# Patient Record
Sex: Female | Born: 1965 | Race: White | Hispanic: No | Marital: Married | Smoking: Never smoker
Health system: Southern US, Community
[De-identification: ages and names within clinical notes are randomized; demographics above are authoritative.]

## PROBLEM LIST (undated history)

## (undated) DIAGNOSIS — L4050 Arthropathic psoriasis, unspecified: Secondary | ICD-10-CM

## (undated) DIAGNOSIS — E119 Type 2 diabetes mellitus without complications: Secondary | ICD-10-CM

## (undated) DIAGNOSIS — M1990 Unspecified osteoarthritis, unspecified site: Secondary | ICD-10-CM

## (undated) DIAGNOSIS — M4800 Spinal stenosis, site unspecified: Secondary | ICD-10-CM

---

## 1998-07-11 ENCOUNTER — Inpatient Hospital Stay (HOSPITAL_COMMUNITY): Admission: AD | Admit: 1998-07-11 | Discharge: 1998-07-11 | Payer: Self-pay | Admitting: Obstetrics and Gynecology

## 1999-08-13 ENCOUNTER — Encounter: Payer: Self-pay | Admitting: Obstetrics and Gynecology

## 1999-08-13 ENCOUNTER — Ambulatory Visit (HOSPITAL_COMMUNITY): Admission: RE | Admit: 1999-08-13 | Discharge: 1999-08-13 | Payer: Self-pay | Admitting: Obstetrics and Gynecology

## 1999-12-11 ENCOUNTER — Encounter: Payer: Self-pay | Admitting: Obstetrics and Gynecology

## 1999-12-11 ENCOUNTER — Ambulatory Visit (HOSPITAL_COMMUNITY): Admission: RE | Admit: 1999-12-11 | Discharge: 1999-12-11 | Payer: Self-pay | Admitting: Obstetrics and Gynecology

## 2000-02-17 ENCOUNTER — Ambulatory Visit (HOSPITAL_COMMUNITY): Admission: RE | Admit: 2000-02-17 | Discharge: 2000-02-17 | Payer: Self-pay | Admitting: Obstetrics and Gynecology

## 2000-02-17 ENCOUNTER — Encounter: Payer: Self-pay | Admitting: Obstetrics and Gynecology

## 2000-02-20 ENCOUNTER — Ambulatory Visit (HOSPITAL_COMMUNITY): Admission: RE | Admit: 2000-02-20 | Discharge: 2000-02-20 | Payer: Self-pay | Admitting: Obstetrics and Gynecology

## 2000-03-18 ENCOUNTER — Encounter: Payer: Self-pay | Admitting: Obstetrics and Gynecology

## 2000-03-19 ENCOUNTER — Inpatient Hospital Stay (HOSPITAL_COMMUNITY): Admission: AD | Admit: 2000-03-19 | Discharge: 2000-03-19 | Payer: Self-pay | Admitting: Obstetrics and Gynecology

## 2000-03-23 ENCOUNTER — Encounter (HOSPITAL_COMMUNITY): Admission: RE | Admit: 2000-03-23 | Discharge: 2000-03-26 | Payer: Self-pay | Admitting: Obstetrics and Gynecology

## 2000-03-25 ENCOUNTER — Inpatient Hospital Stay (HOSPITAL_COMMUNITY): Admission: AD | Admit: 2000-03-25 | Discharge: 2000-03-28 | Payer: Self-pay | Admitting: Obstetrics and Gynecology

## 2000-03-25 ENCOUNTER — Encounter (INDEPENDENT_AMBULATORY_CARE_PROVIDER_SITE_OTHER): Payer: Self-pay | Admitting: Specialist

## 2000-03-29 ENCOUNTER — Encounter: Admission: RE | Admit: 2000-03-29 | Discharge: 2000-04-16 | Payer: Self-pay | Admitting: Obstetrics and Gynecology

## 2003-09-05 ENCOUNTER — Other Ambulatory Visit: Admission: RE | Admit: 2003-09-05 | Discharge: 2003-09-05 | Payer: Self-pay | Admitting: Obstetrics and Gynecology

## 2005-05-16 ENCOUNTER — Encounter: Admission: RE | Admit: 2005-05-16 | Discharge: 2005-05-16 | Payer: Self-pay | Admitting: Family Medicine

## 2008-01-12 ENCOUNTER — Encounter: Admission: RE | Admit: 2008-01-12 | Discharge: 2008-01-12 | Payer: Self-pay | Admitting: Family Medicine

## 2009-01-30 ENCOUNTER — Encounter: Admission: RE | Admit: 2009-01-30 | Discharge: 2009-01-30 | Payer: Self-pay | Admitting: Family Medicine

## 2010-08-31 ENCOUNTER — Emergency Department (HOSPITAL_COMMUNITY): Payer: BC Managed Care – PPO

## 2010-08-31 ENCOUNTER — Emergency Department (HOSPITAL_COMMUNITY)
Admission: EM | Admit: 2010-08-31 | Discharge: 2010-08-31 | Disposition: A | Discharge status: Home / Self Care | Payer: BC Managed Care – PPO | Attending: Emergency Medicine | Admitting: Emergency Medicine

## 2010-08-31 DIAGNOSIS — R0989 Other specified symptoms and signs involving the circulatory and respiratory systems: Secondary | ICD-10-CM | POA: Insufficient documentation

## 2010-08-31 DIAGNOSIS — M069 Rheumatoid arthritis, unspecified: Secondary | ICD-10-CM | POA: Insufficient documentation

## 2010-08-31 DIAGNOSIS — E041 Nontoxic single thyroid nodule: Secondary | ICD-10-CM | POA: Insufficient documentation

## 2010-08-31 DIAGNOSIS — R0609 Other forms of dyspnea: Secondary | ICD-10-CM | POA: Insufficient documentation

## 2010-08-31 DIAGNOSIS — R4789 Other speech disturbances: Secondary | ICD-10-CM | POA: Insufficient documentation

## 2010-08-31 DIAGNOSIS — I10 Essential (primary) hypertension: Secondary | ICD-10-CM | POA: Insufficient documentation

## 2010-08-31 DIAGNOSIS — R072 Precordial pain: Secondary | ICD-10-CM | POA: Insufficient documentation

## 2010-08-31 DIAGNOSIS — R0602 Shortness of breath: Secondary | ICD-10-CM | POA: Insufficient documentation

## 2010-08-31 DIAGNOSIS — I517 Cardiomegaly: Secondary | ICD-10-CM | POA: Insufficient documentation

## 2010-08-31 LAB — PREGNANCY, URINE: Preg Test, Ur: NEGATIVE

## 2010-08-31 LAB — BASIC METABOLIC PANEL
Chloride: 104 mEq/L (ref 96–112)
GFR calc non Af Amer: >60 mL/min (ref >60)
Potassium: 3.3 mEq/L — ABNORMAL LOW (ref 3.5–5.1)
Sodium: 140 mEq/L (ref 135–145)

## 2010-08-31 LAB — CBC
Hemoglobin: 13.2 g/dL (ref 12.0–15.0)
MCV: 89.1 fL (ref 78.0–100.0)
Platelets: 227 10*3/uL (ref 150–400)
RBC: 4.11 MIL/uL (ref 3.87–5.11)
WBC: 8 10*3/uL (ref 4.0–10.5)

## 2010-08-31 LAB — URINALYSIS, ROUTINE W REFLEX MICROSCOPIC
Nitrite: NEGATIVE
Specific Gravity, Urine: 1.028 (ref 1.005–1.030)
Urobilinogen, UA: 0.2 mg/dL (ref 0.0–1.0)

## 2010-08-31 LAB — URINE MICROSCOPIC-ADD ON

## 2010-08-31 LAB — ETHANOL: Alcohol, Ethyl (B): <5 mg/dL (ref 0–10)

## 2010-08-31 LAB — POCT CARDIAC MARKERS: Myoglobin, poc: 19.5 ng/mL (ref 12–200)

## 2010-08-31 LAB — D-DIMER, QUANTITATIVE: D-Dimer, Quant: 0.88 ug/mL-FEU — ABNORMAL HIGH (ref 0.00–0.48)

## 2010-08-31 LAB — DIFFERENTIAL
Eosinophils Absolute: 0.1 10*3/uL (ref 0.0–0.7)
Lymphs Abs: 1.6 10*3/uL (ref 0.7–4.0)
Neutro Abs: 5.5 10*3/uL (ref 1.7–7.7)
Neutrophils Relative %: 68 % (ref 43–77)

## 2010-08-31 LAB — RAPID URINE DRUG SCREEN, HOSP PERFORMED
Amphetamines: NOT DETECTED
Tetrahydrocannabinol: NOT DETECTED

## 2010-08-31 MED ORDER — IOHEXOL 300 MG/ML  SOLN: 100.0000 mL | Freq: Once | INTRAMUSCULAR | Status: DC | PRN

## 2010-09-04 MED ORDER — IOHEXOL 300 MG/ML  SOLN
100.0000 mL | Freq: Once | INTRAMUSCULAR | Status: AC | PRN
  Administered 2010-09-04: 100 mL via INTRAVENOUS

## 2010-11-08 NOTE — H&P (Signed)
[facility]  Patient:    Melody Carpenter, Melody Carpenter                     MRN: [id number] Adm. Date:  [id number] Attending:  Malon Kindle                         History and Physical  HISTORY OF PRESENT ILLNESS:   A 45 year old white married female, para 1-0-1-1 gravida 3, last menstrual period August 04, 1999, [facility] May 10, 2000 by dates and May 12, 2000 by ultrasound, admitted in active labor with twins - vertex/breech.  Blood group and type O positive with negative antibody and nonreactive serology, rubella immune, hepatitis B surface antigen negative, HIV negative, GC and chlamydia negative, triple screen normal, one-hour Glucola 148. Three-hour GTT 77, 195, 162, and 139.  The patient had a vaginal ultrasound on October 03, 1999.  Crown-rump length was 1.2 cm, eight weeks two days, [facility] May 12, 2000, this twin gestation.  By December 11, 1999, the patient had a second ultrasound that showed an average gestational age of [redacted] weeks and three days, and EDC of essentially the same day.  The patient had concordant growth by follow-up ultrasound.  She did have some contractions about a week ago that were stopped with terbutaline.  At 1 a.m. today, she awoke with contractions and bloody mucous. By 3 a.m. her contractions were regular and painful.  She came here and the cervix was 4 cm, ultrasound showed vertex/breech.  PAST MEDICAL HISTORY:  ALLERGIES:                    No known allergies.  OPERATIONS:                   None.  ILLNESSES:                    Anxiety, kidney stones, rheumatid arthritis.  FAMILY HISTORY:               Mother with high blood pressure, father with diabetes.  OBSTETRICAL HISTORY:          In March 1995, 6 pound 5 ounce female vaginally with preterm premature ruptured membranes.  In January 2000, a blighted ovum.  PHYSICAL EXAMINATION ON ADMISSION:  VITAL SIGNS:                  Temperature 98.1, pulse 98,  respirations 22, blood pressure 140/93.  HEENT:                        Negative.  HEART:                        Normal size and sounds, no murmurs.  LUNGS:                        Clear to P&A.  ABDOMEN:                      Soft.  Fundal height had been 44 cm on September room 27, 2001.  Fetal heart tones were reactive.  No decelerations.  PELVIC:                       Cervix 6 cm, 100%, vertex at a -2.  IMAGING:  Ultrasound showed vertex/breech.  ADMITTING IMPRESSION:         Intrauterine pregnancy 33+ weeks, twin gestation, active labor, vertex/breech.  The patient is prepared for C section. DD:  03/25/00 TD:  03/25/00 Job: [id number]

## 2010-11-08 NOTE — Discharge Summary (Signed)
[facility]  Patient:    Melody Carpenter, Melody Carpenter                     MRN: [id number] Adm. Date:  [id number] Disc. Date: [id number] Attending:  Malon Kindle                           Discharge Summary  ADMISSION DIAGNOSES:          1. Twin intrauterine pregnancy at 33 weeks.                               2. Preterm labor.  COMPLICATIONS:                Prenatal care complicated by twin pregnancy with concordant growth.  BRIEF HISTORY:                At approximately 0100 on the day of admission she had some contractions with bloody mucus and two hours later the contractions were regular and painful and she came to be evaluated and her cervix was found to be 4 cm dilated.  Ultrasound revealed the babies to be in the vertex breech presentation.  Prenatal care was otherwise uncomplicated.  PRENATAL LABS:                Blood type O positive with a negative antibody screen, RPR nonreactive, rubella immune, hepatitis B surface antigen negative, HIV negative, gonorrhea and chlamydia negative, triple screen normal, glucola 148.  Three hour GTT 77, 195, 162, and 139.  PAST OB HISTORY:              In March of 1995, she had a vaginal delivery complicated by preterm premature ruptured membranes, 6 pounds 5 ounces, otherwise uncomplicated.  In January of 2000, she had a D&C for a blighted ovum.  PAST MEDICAL HISTORY:         This is significant for anxiety, kidney stones and rheumatoid arthritis.  PHYSICAL EXAMINATION:  VITAL SIGNS:                  She was afebrile with stable vital signs.  ABDOMEN:                      Her abdomen was soft with a fundal height of 44 cm. Fetal heart tracings were reactive without decelerations.  PELVIC:                       On Malachi Pro. Henley, M.D.s, first examination she was 6 cm dilated, completely effaced, and -2 station with vertex presentation.  HOSPITAL COURSE:              The patient was admitted by Dr.  Ambrose Mantle who elected to proceed with a C-section for delivery.  Early on the morning of March 25, 2000, she underwent a low transverse cesarean section with delivery of viable female twins.  Baby A weighed 4 pounds 15 ounces, had Apgars of 8 and 9.  Baby B weighed 4 pounds 5 ounces and also had Apgars of 8 and 9.  Surgery was done under spinal anesthesia with estimated blood loss of 1000 cc. Postpartum she did very well and remained afebrile and was rapidly able to ambulate and tolerate a regular diet.  Predelivery hemoglobin was 12.8, post delivery was 11.6.  The babies did well in the NICU and on March 28, 2000, were to be transferred to the [facility] pediatric intensive care unit to make room in the neonatal intensive care unit.  At this time the patient was felt to be stable enough for discharge home.  Her incision was healing well and her staples were removed and Steri-Strips applied.  CONDITION ON DISCHARGE:       Stable.  DISPOSITION:                  The patient was discharged to home.  DISCHARGE INSTRUCTIONS:       1. She will be on a regular diet.                               2. Activity:  Pelvic rest with                                  no strenuous activity and no driving.                               3. Her follow-up is in 12 to 13 days for an                                  incision check.  DISCHARGE MEDICATIONS:        Percocet p.r.n. pain.  She is given a discharge pamphlet. DD:  03/28/00 TD:  03/30/00 Job: [id number]

## 2010-11-08 NOTE — Op Note (Signed)
[facility]  Patient:    Melody Carpenter, Melody Carpenter                     MRN: [id number] Proc. Date: 03/25/00 Adm. Date:  [id number] Attending:  Malon Kindle                           Operative Report  PREOPERATIVE DIAGNOSIS:       Intrauterine pregnancy at 33+ weeks, active                               labor, twin gestation, vertex breech.  POSTOPERATIVE DIAGNOSIS:      Intrauterine pregnancy at 33+ weeks, active                               labor, twin gestation, vertex breech.  OPERATION:                    Low transverse cervical cesarean section.  SURGEON:                      Malachi Pro. Ambrose Mantle, M.D.  ASSISTANT:  ANESTHESIA:                   Spinal anesthesia.  DESCRIPTION OF PROCEDURE:     The patient was brought to the operating room and given a spinal anesthetic by Gretta Cool., M.D.  She was placed in the left lateral tilt position. Fetal heart tones were recorded as normal. The abdomen, vulva, and urethra were prepped with Betadine solution.  A Foley catheter was inserted to straight drain. The patient had been 6 cm in maternity admission unit. The abdomen was then draped as a sterile field. Anesthesia was confirmed. A transverse incision was made and carried in layers through the skin, subcutaneous tissue and fascia. The fascia was then incised transversely, separated from the rectus muscles superiorly and inferiorly. The rectus muscle was cut in the midline.  The peritoneum was opened vertically.  An incision was made into the lower uterine segment peritoneum. The bladder was pushed inferiorly and the incision was then made into the uterine cavity.  With my finger, after starting the incision with the knife, the incision was extended laterally with the bandage scissors.  Baby A was 4 pounds 15 ounce female delivered vertex without difficulty.  Nose and pharynx were suctioned with the bulb.  Cord was clamped and the infant was  given to Angelita Ingles, M.D., who was in attendance.  I then did an amniotomy on twin B.  Both fluid sacs were completely clear.  The baby B was presenting as a frank breech.  I delivered the buttocks through the incisional opening and gradually pulled down on the baby, delivered each shoulder. The head delivered without difficulty. The bulb dropped on the floor so I clamped the cord and gave the infant to Dr. Ruben Gottron, who was in attendance. The infant B was 4 pounds 5 ounces. It was also female. Both babies had Apgars of 8 at one minute and 9 at five minutes.  The placenta was removed intact. The uterus was palpated inside and found to be free of any more products of conception.  Both tubes and ovaries appeared normal. The uterine incision was closed with  two running sutures of 0 Vicryl locking the first layer and nonlocking on the second layer.  Liberal irrigation confirmed hemostasis.  I did not reperitonealize the uterus or the abdominal wall.  I closed the rectus muscle with several interrupted sutures of 0 Vicryl.  The fascia was closed with two running sutures of 0 Vicryl, subcutaneous tissue with a running 3-0 Vicryl and staples were used for the skin.  The patient seemed to tolerate the procedure well and was returned to recovery in satisfactory condition. DD:  03/25/00 TD:  03/25/00 Job: [id number]

## 2011-04-02 DIAGNOSIS — L4050 Arthropathic psoriasis, unspecified: Secondary | ICD-10-CM | POA: Diagnosis present

## 2012-06-18 ENCOUNTER — Encounter (HOSPITAL_COMMUNITY): Payer: Self-pay | Admitting: Emergency Medicine

## 2012-06-18 ENCOUNTER — Emergency Department (HOSPITAL_COMMUNITY): Payer: BC Managed Care – PPO

## 2012-06-18 ENCOUNTER — Observation Stay (HOSPITAL_COMMUNITY)
Admission: EM | Admit: 2012-06-18 | Discharge: 2012-06-19 | DRG: 294 | Disposition: A | Discharge status: Home / Self Care | Payer: BC Managed Care – PPO | Attending: Internal Medicine | Admitting: Internal Medicine

## 2012-06-18 DIAGNOSIS — E872 Acidosis, unspecified: Secondary | ICD-10-CM | POA: Diagnosis present

## 2012-06-18 DIAGNOSIS — R739 Hyperglycemia, unspecified: Secondary | ICD-10-CM

## 2012-06-18 DIAGNOSIS — Z79899 Other long term (current) drug therapy: Secondary | ICD-10-CM

## 2012-06-18 DIAGNOSIS — L4050 Arthropathic psoriasis, unspecified: Secondary | ICD-10-CM | POA: Diagnosis present

## 2012-06-18 DIAGNOSIS — E1101 Type 2 diabetes mellitus with hyperosmolarity with coma: Principal | ICD-10-CM | POA: Diagnosis present

## 2012-06-18 DIAGNOSIS — E876 Hypokalemia: Secondary | ICD-10-CM | POA: Diagnosis present

## 2012-06-18 DIAGNOSIS — Z794 Long term (current) use of insulin: Secondary | ICD-10-CM

## 2012-06-18 DIAGNOSIS — R0602 Shortness of breath: Secondary | ICD-10-CM

## 2012-06-18 DIAGNOSIS — E871 Hypo-osmolality and hyponatremia: Secondary | ICD-10-CM | POA: Diagnosis present

## 2012-06-18 HISTORY — DX: Arthropathic psoriasis, unspecified: L40.50

## 2012-06-18 HISTORY — DX: Unspecified osteoarthritis, unspecified site: M19.90

## 2012-06-18 LAB — POCT I-STAT 3, VENOUS BLOOD GAS (G3P V)
Acid-Base Excess: 2 mmol/L (ref 0.0–2.0)
Bicarbonate: 26.6 mEq/L — ABNORMAL HIGH (ref 20.0–24.0)
O2 Saturation: 75 %
TCO2: 28 mmol/L (ref 0–100)
pCO2, Ven: 39.9 mmHg — ABNORMAL LOW (ref 45.0–50.0)
pH, Ven: 7.432 — ABNORMAL HIGH (ref 7.250–7.300)
pO2, Ven: 39 mmHg (ref 30.0–45.0)

## 2012-06-18 LAB — CG4 I-STAT (LACTIC ACID): Lactic Acid, Venous: 6.12 mmol/L — ABNORMAL HIGH (ref 0.5–2.2)

## 2012-06-18 LAB — GLUCOSE, CAPILLARY
Glucose-Capillary: 148 mg/dL — ABNORMAL HIGH (ref 70–99)
Glucose-Capillary: 198 mg/dL — ABNORMAL HIGH (ref 70–99)
Glucose-Capillary: 320 mg/dL — ABNORMAL HIGH (ref 70–99)
Glucose-Capillary: 396 mg/dL — ABNORMAL HIGH (ref 70–99)
Glucose-Capillary: 405 mg/dL — ABNORMAL HIGH (ref 70–99)
Glucose-Capillary: 574 mg/dL (ref 70–99)

## 2012-06-18 LAB — CBC WITH DIFFERENTIAL/PLATELET
Basophils Absolute: 0 10*3/uL (ref 0.0–0.1)
Basophils Relative: 0 % (ref 0–1)
Eosinophils Absolute: 0.1 10*3/uL (ref 0.0–0.7)
Eosinophils Relative: 1 % (ref 0–5)
HCT: 43.5 % (ref 36.0–46.0)
Hemoglobin: 15.5 g/dL — ABNORMAL HIGH (ref 12.0–15.0)
Lymphocytes Relative: 20 % (ref 12–46)
Lymphs Abs: 2 10*3/uL (ref 0.7–4.0)
MCH: 31.7 pg (ref 26.0–34.0)
MCHC: 35.6 g/dL (ref 30.0–36.0)
MCV: 89 fL (ref 78.0–100.0)
Monocytes Absolute: 0.7 10*3/uL (ref 0.1–1.0)
Monocytes Relative: 7 % (ref 3–12)
Neutro Abs: 7.1 10*3/uL (ref 1.7–7.7)
Neutrophils Relative %: 71 % (ref 43–77)
Platelets: 254 10*3/uL (ref 150–400)
RBC: 4.89 MIL/uL (ref 3.87–5.11)
RDW: 13.8 % (ref 11.5–15.5)
WBC: 10 10*3/uL (ref 4.0–10.5)

## 2012-06-18 LAB — URINALYSIS, ROUTINE W REFLEX MICROSCOPIC
Bilirubin Urine: NEGATIVE
Glucose, UA: >1000 mg/dL — AB
Ketones, ur: NEGATIVE mg/dL
Nitrite: NEGATIVE
Protein, ur: NEGATIVE mg/dL
Specific Gravity, Urine: 1.035 — ABNORMAL HIGH (ref 1.005–1.030)
Urobilinogen, UA: 0.2 mg/dL (ref 0.0–1.0)
pH: 6 (ref 5.0–8.0)

## 2012-06-18 LAB — BASIC METABOLIC PANEL
BUN: 12 mg/dL (ref 6–23)
CO2: 21 mEq/L (ref 19–32)
Calcium: 10.4 mg/dL (ref 8.4–10.5)
Chloride: 87 mEq/L — ABNORMAL LOW (ref 96–112)
Creatinine, Ser: 0.47 mg/dL — ABNORMAL LOW (ref 0.50–1.10)
GFR calc Af Amer: >90 mL/min (ref >90)
GFR calc non Af Amer: >90 mL/min (ref >90)
Glucose, Bld: 621 mg/dL (ref 70–99)
Potassium: 3.8 mEq/L (ref 3.5–5.1)
Sodium: 130 mEq/L — ABNORMAL LOW (ref 135–145)

## 2012-06-18 LAB — URINE MICROSCOPIC-ADD ON

## 2012-06-18 LAB — CBC
MCH: 31.8 pg (ref 26.0–34.0)
MCHC: 35.6 g/dL (ref 30.0–36.0)
MCV: 89.4 fL (ref 78.0–100.0)
Platelets: 209 10*3/uL (ref 150–400)

## 2012-06-18 LAB — CREATININE, SERUM: Creatinine, Ser: 0.69 mg/dL (ref 0.50–1.10)

## 2012-06-18 MED ORDER — METOPROLOL SUCCINATE ER 50 MG PO TB24
50.0000 mg | ORAL_TABLET | Freq: Every day | ORAL | Status: DC
  Administered 2012-06-19: 50 mg via ORAL
  Filled 2012-06-18: qty 1

## 2012-06-18 MED ORDER — ACETAMINOPHEN 650 MG RE SUPP: 650.0000 mg | Freq: Four times a day (QID) | RECTAL | Status: DC | PRN

## 2012-06-18 MED ORDER — INSULIN REGULAR BOLUS VIA INFUSION
0.0000 [IU] | Freq: Three times a day (TID) | INTRAVENOUS | Status: DC
  Filled 2012-06-18: qty 10

## 2012-06-18 MED ORDER — ALBUTEROL SULFATE HFA 108 (90 BASE) MCG/ACT IN AERS: 2.0000 | INHALATION_SPRAY | RESPIRATORY_TRACT | Status: DC | PRN

## 2012-06-18 MED ORDER — ASPIRIN EC 81 MG PO TBEC
81.0000 mg | DELAYED_RELEASE_TABLET | Freq: Every day | ORAL | Status: DC
  Administered 2012-06-19: 81 mg via ORAL
  Filled 2012-06-18: qty 1

## 2012-06-18 MED ORDER — INSULIN GLARGINE 100 UNIT/ML ~~LOC~~ SOLN
5.0000 [IU] | Freq: Every day | SUBCUTANEOUS | Status: DC
  Administered 2012-06-18: 5 [IU] via SUBCUTANEOUS

## 2012-06-18 MED ORDER — HEPARIN SODIUM (PORCINE) 5000 UNIT/ML IJ SOLN
5000.0000 [IU] | Freq: Three times a day (TID) | INTRAMUSCULAR | Status: DC
  Administered 2012-06-18 – 2012-06-19 (×2): 5000 [IU] via SUBCUTANEOUS
  Filled 2012-06-18 (×5): qty 1

## 2012-06-18 MED ORDER — SODIUM CHLORIDE 0.9 % IV SOLN
INTRAVENOUS | Status: DC
  Administered 2012-06-18: 125 mL/h via INTRAVENOUS
  Administered 2012-06-18: 16:00:00 via INTRAVENOUS

## 2012-06-18 MED ORDER — DEXTROSE 50 % IV SOLN: 25.0000 mL | INTRAVENOUS | Status: DC | PRN

## 2012-06-18 MED ORDER — ONDANSETRON HCL 4 MG/2ML IJ SOLN: 4.0000 mg | Freq: Four times a day (QID) | INTRAMUSCULAR | Status: DC | PRN

## 2012-06-18 MED ORDER — SODIUM CHLORIDE 0.9 % IV BOLUS (SEPSIS): 500.0000 mL | Freq: Once | INTRAVENOUS | Status: DC

## 2012-06-18 MED ORDER — PAROXETINE HCL 20 MG PO TABS
20.0000 mg | ORAL_TABLET | Freq: Every day | ORAL | Status: DC
  Administered 2012-06-19: 20 mg via ORAL
  Filled 2012-06-18: qty 1

## 2012-06-18 MED ORDER — SODIUM CHLORIDE 0.9 % IJ SOLN: 3.0000 mL | Freq: Two times a day (BID) | INTRAMUSCULAR | Status: DC

## 2012-06-18 MED ORDER — SODIUM CHLORIDE 0.9 % IV SOLN
INTRAVENOUS | Status: DC
  Administered 2012-06-18: 3.1 [IU]/h via INTRAVENOUS
  Filled 2012-06-18: qty 1

## 2012-06-18 MED ORDER — DEXTROSE-NACL 5-0.45 % IV SOLN: INTRAVENOUS | Status: DC

## 2012-06-18 MED ORDER — SODIUM CHLORIDE 0.9 % IV BOLUS (SEPSIS)
500.0000 mL | Freq: Once | INTRAVENOUS | Status: AC
  Administered 2012-06-18: 500 mL via INTRAVENOUS

## 2012-06-18 MED ORDER — SODIUM CHLORIDE 0.9 % IV SOLN
INTRAVENOUS | Status: DC
  Administered 2012-06-18: 3.5 [IU]/h via INTRAVENOUS
  Administered 2012-06-18: 2.4 [IU]/h via INTRAVENOUS
  Filled 2012-06-18: qty 1

## 2012-06-18 MED ORDER — SODIUM CHLORIDE 0.9 % IV BOLUS (SEPSIS)
1000.0000 mL | Freq: Once | INTRAVENOUS | Status: AC
  Administered 2012-06-18: 1000 mL via INTRAVENOUS

## 2012-06-18 MED ORDER — ONDANSETRON HCL 4 MG PO TABS: 4.0000 mg | ORAL_TABLET | Freq: Four times a day (QID) | ORAL | Status: DC | PRN

## 2012-06-18 MED ORDER — FOLIC ACID 1 MG PO TABS
1.0000 mg | ORAL_TABLET | Freq: Every day | ORAL | Status: DC
  Administered 2012-06-19: 1 mg via ORAL
  Filled 2012-06-18: qty 1

## 2012-06-18 MED ORDER — POLYETHYLENE GLYCOL 3350 17 G PO PACK
17.0000 g | PACK | Freq: Every day | ORAL | Status: DC | PRN
  Filled 2012-06-18: qty 1

## 2012-06-18 MED ORDER — LEVOFLOXACIN 750 MG PO TABS
750.0000 mg | ORAL_TABLET | Freq: Every day | ORAL | Status: DC
  Administered 2012-06-19: 750 mg via ORAL
  Filled 2012-06-18: qty 1

## 2012-06-18 MED ORDER — HYDROCHLOROTHIAZIDE 25 MG PO TABS
25.0000 mg | ORAL_TABLET | Freq: Every day | ORAL | Status: DC
  Filled 2012-06-18: qty 1

## 2012-06-18 MED ORDER — HYDROCODONE-ACETAMINOPHEN 5-325 MG PO TABS
1.0000 | ORAL_TABLET | ORAL | Status: DC | PRN
  Administered 2012-06-19 (×2): 2 via ORAL
  Filled 2012-06-18 (×2): qty 2

## 2012-06-18 MED ORDER — ETANERCEPT 50 MG/ML ~~LOC~~ SOLN: 50.0000 mg | SUBCUTANEOUS | Status: DC

## 2012-06-18 MED ORDER — OSELTAMIVIR PHOSPHATE 75 MG PO CAPS
75.0000 mg | ORAL_CAPSULE | Freq: Two times a day (BID) | ORAL | Status: DC
  Administered 2012-06-18: 75 mg via ORAL
  Filled 2012-06-18 (×3): qty 1

## 2012-06-18 MED ORDER — ACETAMINOPHEN 325 MG PO TABS
650.0000 mg | ORAL_TABLET | Freq: Four times a day (QID) | ORAL | Status: DC | PRN
  Administered 2012-06-18: 650 mg via ORAL
  Filled 2012-06-18: qty 2

## 2012-06-18 MED ORDER — DEXTROSE-NACL 5-0.45 % IV SOLN
INTRAVENOUS | Status: DC
  Administered 2012-06-18: 16:00:00 via INTRAVENOUS

## 2012-06-18 MED ORDER — SODIUM CHLORIDE 0.9 % IV SOLN
INTRAVENOUS | Status: DC
  Administered 2012-06-19: 05:00:00 via INTRAVENOUS

## 2012-06-18 NOTE — ED Notes (Signed)
Pt sent by PCP for new onset DM and hypokalemia; pt went to PCP for cough and not feeling well started on antibiotics for PNA; pt noted to be tachycardic at 150bpm

## 2012-06-18 NOTE — ED Notes (Signed)
Insulin gtt started at 3.5 ml/hr.  Verified by Elijah Birk RN.  Patient denies pain or further needs at this time.

## 2012-06-18 NOTE — ED Notes (Signed)
Patient transported to X-ray 

## 2012-06-18 NOTE — ED Provider Notes (Signed)
History     CSN: [id number]  Arrival date & time 06/18/12  1150   First MD Initiated Contact with Patient 06/18/12 1213      Chief Complaint  Patient presents with  . Hyperglycemia  . Tachycardia  . Cough    (Consider location/radiation/quality/duration/timing/severity/associated sxs/prior treatment) HPI Patient presents emergency department with elevated blood sugars, tachycardia, and weakness.  Patient, states that she was seen by her primary care doctor, who sent her to the emergency room for admission, due to elevated blood sugar.  Patient complains of cough, and chills.  Patient denies chest pain, nausea, vomiting, diarrhea, headache, dizziness, blurred vision, abdominal pain, or syncope.  He, states she's had some increased thirst and frequency of urination.  Patient, states that she's not on any medications for blood sugar, but does take chronic prednisone for her psoriatic arthritis. Past Medical History  Diagnosis Date  . Arthritis     History reviewed. No pertinent past surgical history.  History reviewed. No pertinent family history.  History  Substance Use Topics  . Smoking status: Never Smoker   . Smokeless tobacco: Not on file  . Alcohol Use: No    OB History    Grav Para Term Preterm Abortions TAB SAB Ect Mult Living                  Review of Systems All other systems negative except as documented in the HPI. All pertinent positives and negatives as reviewed in the HPI.  Allergies  Review of patient's allergies indicates no known allergies.  Home Medications   Current Outpatient Rx  Name  Route  Sig  Dispense  Refill  . ALBUTEROL SULFATE HFA 108 (90 BASE) MCG/ACT IN AERS   Inhalation   Inhale 2 puffs into the lungs every 4 (four) hours as needed. For wheezing         . ETANERCEPT 50 MG/ML [place] SOLN   Subcutaneous   Inject 50 mg into the skin once a week.         Marland Kitchen FOLIC ACID 1 MG PO TABS   Oral   Take 1 mg by mouth daily.         Marland Kitchen  HYDROCHLOROTHIAZIDE 25 MG PO TABS   Oral   Take 25 mg by mouth daily.         Marland Kitchen METOPROLOL SUCCINATE ER 50 MG PO TB24   Oral   Take 50 mg by mouth daily. Take with or immediately following a meal.         . MOXIFLOXACIN HCL 400 MG PO TABS   Oral   Take 400 mg by mouth daily. For 10 days. Started 06/17/2012         . NORETHINDRONE 0.35 MG PO TABS   Oral   Take 1 tablet by mouth daily.         Marland Kitchen PAROXETINE HCL 20 MG PO TABS   Oral   Take 20 mg by mouth daily.         Marland Kitchen PRESCRIPTION MEDICATION   Subcutaneous   Inject 1 ampule into the skin once a week.           BP 145/85  Pulse 111  Temp 98.3 F (36.8 C) (Oral)  Resp 17  SpO2 95%  Physical Exam  Nursing note and vitals reviewed. Constitutional: She appears well-developed and well-nourished.  HENT:  Head: Normocephalic and atraumatic.  Mouth/Throat: Mucous membranes are dry. No uvula swelling. No oropharyngeal exudate.  Eyes:  Pupils are equal, round, and reactive to light.  Neck: Normal range of motion. Neck supple.  Cardiovascular: Normal rate, regular rhythm and normal heart sounds.  Exam reveals no gallop and no friction rub.   No murmur heard. Pulmonary/Chest: Effort normal and breath sounds normal. No respiratory distress.  Abdominal: Soft. Bowel sounds are normal. She exhibits no distension. There is no rebound and no guarding.  Skin: Skin is warm and dry. No rash noted.    ED Course  Procedures (including critical care time)  Labs Reviewed  CBC WITH DIFFERENTIAL - Abnormal; Notable for the following:    Hemoglobin 15.5 (*)     All other components within normal limits  BASIC METABOLIC PANEL - Abnormal; Notable for the following:    Sodium 130 (*)     Chloride 87 (*)     Glucose, Bld 621 (*)     Creatinine, Ser 0.47 (*)     All other components within normal limits  URINALYSIS, ROUTINE W REFLEX MICROSCOPIC - Abnormal; Notable for the following:    APPearance CLOUDY (*)     Specific  Gravity, Urine 1.035 (*)     Glucose, UA >1000 (*)     Hgb urine dipstick SMALL (*)     Leukocytes, UA MODERATE (*)     All other components within normal limits  GLUCOSE, CAPILLARY - Abnormal; Notable for the following:    Glucose-Capillary 574 (*)     All other components within normal limits  POCT I-STAT 3, BLOOD GAS (G3P V) - Abnormal; Notable for the following:    pH, Ven 7.432 (*)     pCO2, Ven 39.9 (*)     Bicarbonate 26.6 (*)     All other components within normal limits  CG4 I-STAT (LACTIC ACID) - Abnormal; Notable for the following:    Lactic Acid, Venous 6.12 (*)     All other components within normal limits  URINE MICROSCOPIC-ADD ON - Abnormal; Notable for the following:    Squamous Epithelial / LPF FEW (*)     Bacteria, UA FEW (*)     All other components within normal limits  GLUCOSE, CAPILLARY - Abnormal; Notable for the following:    Glucose-Capillary 405 (*)     All other components within normal limits  URINE CULTURE   Dg Chest 2 View  06/18/2012  *RADIOLOGY REPORT*  Clinical Data: Cough, fever.  CHEST - 2 VIEW  Comparison: 06/17/2012  Findings: Heart and mediastinal contours are within normal limits. No focal opacities or effusions.  No acute bony abnormality.  IMPRESSION: No active cardiopulmonary disease.   Original Report Authenticated By: Charlett Nose, M.D.     The patient will be admitted to the hospital for her elevated blood sugars. The patient is stable throughout her ER Visit. Patient is advised of the plan.    MDM  MDM Reviewed: vitals and nursing note Interpretation: labs, x-ray and ECG Consults: admitting MD    Date: 06/18/2012  Rate: 140  Rhythm: sinus tachycardia  QRS Axis: normal  Intervals: normal  ST/T Wave abnormalities: normal  Conduction Disutrbances:none  Narrative Interpretation:   Old EKG Reviewed: none available             Carlyle Dolly, PA-C 06/21/12 1649

## 2012-06-18 NOTE — H&P (Addendum)
[facility] History and Physical  Dafina Suk HYQ:[id number] DOB: 1965-08-08 DOA: 06/18/2012  Referring physician: Dr. Harl Bowie PCP: Nonnie Done., MD  Specialists: none  Chief Complaint: polydipsia and polyurea  HPI: Melody Carpenter is a 46 y.o. female  With past medical history of psoriatic arthritis, but started getting cough and shortness of breath one week prior to admission progressively getting worse. Went and saw her primary care doctor vitals were checked and she was tachycardic at that time, CBG was checked and showed a blood glucose greater than 600. She's also been complaining of some polydipsia and polyuria for the last for 5 days. She relates that her shortness of breath started about 5 days prior to admission with some cough. She is empirically treated with Levaquin one day prior to admission. Feeling weak and tired. She denies any fevers diarrhea, chest pain , denies abdominal pain.  Review of Systems: The patient denies anorexia, fever, weight loss,, vision loss, decreased hearing, hoarseness, chest pain, syncope, peripheral edema, balance deficits, hemoptysis, abdominal pain, melena, hematochezia, severe indigestion/heartburn, hematuria, incontinence, genital sores, muscle weakness, suspicious skin lesions, transient blindness, difficulty walking, depression, unusual weight change, abnormal bleeding, enlarged lymph nodes, angioedema, and breast masses.    Past Medical History  Diagnosis Date  . Arthritis   . Arthritis with psoriasis    History reviewed. No pertinent past surgical history. Social History:  reports that she has never smoked. She does not have any smokeless tobacco history on file. She reports that she does not drink alcohol or use illicit drugs. Visit (soft and performed all her ADLs  No Known Allergies  Family History  Problem Relation Age of Onset  . Hypertension Mother   . Diabetes Mellitus II Father     Prior to Admission medications     Medication Sig Start Date End Date Taking? Authorizing Provider  albuterol (PROVENTIL HFA;VENTOLIN HFA) 108 (90 BASE) MCG/ACT inhaler Inhale 2 puffs into the lungs every 4 (four) hours as needed. For wheezing   Yes Historical Provider, MD  etanercept (ENBREL) 50 MG/ML injection Inject 50 mg into the skin once a week.   Yes Historical Provider, MD  folic acid (FOLVITE) 1 MG tablet Take 1 mg by mouth daily.   Yes Historical Provider, MD  hydrochlorothiazide (HYDRODIURIL) 25 MG tablet Take 25 mg by mouth daily.   Yes Historical Provider, MD  metoprolol succinate (TOPROL-XL) 50 MG 24 hr tablet Take 50 mg by mouth daily. Take with or immediately following a meal.   Yes Historical Provider, MD  moxifloxacin (AVELOX) 400 MG tablet Take 400 mg by mouth daily. For 10 days. Started 06/17/2012   Yes Historical Provider, MD  norethindrone (MICRONOR,CAMILA,ERRIN) 0.35 MG tablet Take 1 tablet by mouth daily.   Yes Historical Provider, MD  PARoxetine (PAXIL) 20 MG tablet Take 20 mg by mouth daily.   Yes Historical Provider, MD  PRESCRIPTION MEDICATION Inject 1 ampule into the skin once a week.   Yes Historical Provider, MD   Physical Exam: Filed Vitals:   06/18/12 1405 06/18/12 1430 06/18/12 1515 06/18/12 1530  BP:  145/85 162/93 144/107  Pulse:  111 116 117  Temp:      TempSrc:      Resp:  17 29 23   SpO2: 98% 95% 99% 96%     General:  Awake alert and oriented x3 in no acute distress  Eyes: Anicteric no pallor  ENT: Dry mucous membranes with*ulcers on her tongue  Neck: No JVD  Cardiovascular: Regular rate and  rhythm tachycardic with a positive S1 and S2 no murmurs rubs gallops  Respiratory: Good air movement clear to auscultation  Abdomen: Positive bowel sounds nontender nondistended soft  Skin: She has a papular rash over her chest and back and arms.  Musculoskeletal: Intact  Psychiatric: Appropriate  Neurologic: Awake alert and oriented x4 oriented for language and nonfocal  Labs on  Admission:  Basic Metabolic Panel:  Lab 06/18/12 1610  NA 130*  K 3.8  CL 87*  CO2 21  GLUCOSE 621*  BUN 12  CREATININE 0.47*  CALCIUM 10.4  MG --  PHOS --   Liver Function Tests: No results found for this basename: AST:5,ALT:5,ALKPHOS:5,BILITOT:5,PROT:5,ALBUMIN:5 in the last 168 hours No results found for this basename: LIPASE:5,AMYLASE:5 in the last 168 hours No results found for this basename: AMMONIA:5 in the last 168 hours CBC:  Lab 06/18/12 1231  WBC 10.0  NEUTROABS 7.1  HGB 15.5*  HCT 43.5  MCV 89.0  PLT 254   Cardiac Enzymes: No results found for this basename: CKTOTAL:5,CKMB:5,CKMBINDEX:5,TROPONINI:5 in the last 168 hours  BNP (last 3 results) No results found for this basename: PROBNP:3 in the last 8760 hours CBG:  Lab 06/18/12 1534 06/18/12 1433 06/18/12 1240  GLUCAP 299* 405* 574*    Radiological Exams on Admission: Dg Chest 2 View  06/18/2012  *RADIOLOGY REPORT*  Clinical Data: Cough, fever.  CHEST - 2 VIEW  Comparison: 06/17/2012  Findings: Heart and mediastinal contours are within normal limits. No focal opacities or effusions.  No acute bony abnormality.  IMPRESSION: No active cardiopulmonary disease.   Original Report Authenticated By: Charlett Nose, M.D.     EKG: Sinus tachycardia with right superior axis deviation  Nonspecific T wave changes.  Assessment/Plan Hyperglycemic hyperosmolar nonketotic coma: - Admitted to telemetry unit, start her on IV insulin drip, as check CBGs q. hourly. Basic metabolic panel every 4 hours. We'll go ahead and start her on low-dose Lantus and start her on sliding scale when her blood glucose reaches 200.  - She doesn't have any fevers at home, no leukocytosis here in the hospital. She does relate coughing and shortness of breath,  chest x-ray does not show any infiltrates, I will repeat a chest x-ray in the morning as 20% of the pneumonia  have a negative chest x-ray on admission and she seems to be intravascularly  depleted.  - She has never been told she is a diabetic. While in the hospital start her on Lantus to cover her elevated blood glucose. She might benefit from oral regimen to start her treatment as an outpatient.  - We'll go ahead and give her an additional 2 L of normal saline and continue her 125 cc an hour. We'll monitor strict I.'s and O.'s. Once her blood glucose reaches below 200 and start her on D5 half-normal.  - She doesn't have any signs of ischemia on EKG, not complaining of any chest pain. Which  triggered this episode of hyperglycemia. She is not complaining of any abdominal pain or vomiting which makes appendicitis unlikely. She has not started any new medications recently. She does have a history of what seemed like the upper respiratory tract infection about a week ago.  SOB (shortness of breath): - Concerning for her shortness of breath that she says has been progressively getting worse. She was initially treated with moxifloxacin. I will go ahead and continue the antibiotic. We'll repeat a chest x-ray in the morning. As she might have a partially treated pneumonia. As her  white count is borderline high. Her chest x-ray tomorrow morning does not show any infiltrate can D/c antibiotics.  Hyponatremia - This probably pseudohyponatremia because of an elevated blood glucose. Will recheck in the morning once glucose is stabilized months.  Lactic acid acidosis: - She is not hypoxic, and she said she was making urine before coming into the hospital. Her blood pressure has not been low here in the emergency room. She's not in DKA which can also cause lactic acidosis. She is not complaining of any chest pain, she is short of breath which can be explained by a upper respiratory tract infection.  - Her urine doesn't show any ketones, her metabolic panel bicarbonate is only borderline low at 21. Which makes the DKA unlikely.   Code Status: full Family Communication: mother Disposition Plan:  home 1-2 days  Time spent: 60 min  Marinda Elk [facility] Pager [phone]  If 7PM-7AM, please contact night-coverage [email or web address] Password [email or web address] 06/18/2012, 3:52 PM

## 2012-06-18 NOTE — ED Notes (Signed)
Pt tearful when talking about dx of DM

## 2012-06-18 NOTE — ED Notes (Signed)
621 Critical CBG from lab. Reported to MD

## 2012-06-18 NOTE — ED Notes (Signed)
I Stat venous blood gas results given to Dr. Juleen China.

## 2012-06-18 NOTE — ED Notes (Signed)
Comfort given,  MD to bedside

## 2012-06-19 ENCOUNTER — Inpatient Hospital Stay (HOSPITAL_COMMUNITY): Payer: BC Managed Care – PPO

## 2012-06-19 DIAGNOSIS — E871 Hypo-osmolality and hyponatremia: Secondary | ICD-10-CM

## 2012-06-19 DIAGNOSIS — E1101 Type 2 diabetes mellitus with hyperosmolarity with coma: Secondary | ICD-10-CM

## 2012-06-19 LAB — URINE CULTURE
Colony Count: NO GROWTH
Culture: NO GROWTH

## 2012-06-19 LAB — CBC
MCV: 89.9 fL (ref 78.0–100.0)
Platelets: 199 10*3/uL (ref 150–400)
RBC: 3.97 MIL/uL (ref 3.87–5.11)
RDW: 13.7 % (ref 11.5–15.5)
WBC: 7.4 10*3/uL (ref 4.0–10.5)

## 2012-06-19 LAB — GLUCOSE, CAPILLARY
Glucose-Capillary: 156 mg/dL — ABNORMAL HIGH (ref 70–99)
Glucose-Capillary: 137 mg/dL — ABNORMAL HIGH (ref 70–99)
Glucose-Capillary: 192 mg/dL — ABNORMAL HIGH (ref 70–99)
Glucose-Capillary: 215 mg/dL — ABNORMAL HIGH (ref 70–99)

## 2012-06-19 LAB — COMPREHENSIVE METABOLIC PANEL
AST: 23 U/L (ref 0–37)
Albumin: 2.7 g/dL — ABNORMAL LOW (ref 3.5–5.2)
Chloride: 98 mEq/L (ref 96–112)
Creatinine, Ser: 0.45 mg/dL — ABNORMAL LOW (ref 0.50–1.10)
Potassium: 2.2 mEq/L — CL (ref 3.5–5.1)
Total Bilirubin: 0.4 mg/dL (ref 0.3–1.2)
Total Protein: 5.8 g/dL — ABNORMAL LOW (ref 6.0–8.3)

## 2012-06-19 LAB — INFLUENZA PANEL BY PCR (TYPE A & B)
H1N1 flu by pcr: NOT DETECTED
Influenza A By PCR: NEGATIVE

## 2012-06-19 LAB — TSH: TSH: 1.23 u[IU]/mL (ref 0.350–4.500)

## 2012-06-19 MED ORDER — INSULIN GLARGINE 100 UNIT/ML ~~LOC~~ SOLN
15.0000 [IU] | Freq: Once | SUBCUTANEOUS | Status: AC
  Administered 2012-06-19: 15 [IU] via SUBCUTANEOUS

## 2012-06-19 MED ORDER — INSULIN ASPART 100 UNIT/ML ~~LOC~~ SOLN
0.0000 [IU] | SUBCUTANEOUS | Status: DC
  Administered 2012-06-19: 3 [IU] via SUBCUTANEOUS

## 2012-06-19 MED ORDER — INSULIN GLARGINE 100 UNIT/ML ~~LOC~~ SOLN: 15.0000 [IU] | Freq: Every day | SUBCUTANEOUS | Status: DC

## 2012-06-19 MED ORDER — IBUPROFEN 600 MG PO TABS
600.0000 mg | ORAL_TABLET | Freq: Four times a day (QID) | ORAL | Status: DC | PRN
  Filled 2012-06-19: qty 1

## 2012-06-19 MED ORDER — LIVING WELL WITH DIABETES BOOK
Freq: Once | Status: AC
  Administered 2012-06-19: 11:00:00
  Filled 2012-06-19: qty 1

## 2012-06-19 MED ORDER — INSULIN ASPART 100 UNIT/ML ~~LOC~~ SOLN
3.0000 [IU] | Freq: Three times a day (TID) | SUBCUTANEOUS | Status: DC
  Administered 2012-06-19 (×3): 3 [IU] via SUBCUTANEOUS

## 2012-06-19 MED ORDER — METFORMIN HCL 500 MG PO TABS
500.0000 mg | ORAL_TABLET | Freq: Two times a day (BID) | ORAL | Status: DC
  Administered 2012-06-19: 500 mg via ORAL
  Filled 2012-06-19 (×2): qty 1

## 2012-06-19 MED ORDER — MAGNESIUM OXIDE 400 (241.3 MG) MG PO TABS
800.0000 mg | ORAL_TABLET | Freq: Once | ORAL | Status: AC
  Administered 2012-06-19: 800 mg via ORAL
  Filled 2012-06-19: qty 2

## 2012-06-19 MED ORDER — MAGNESIUM OXIDE 400 (241.3 MG) MG PO TABS
400.0000 mg | ORAL_TABLET | Freq: Two times a day (BID) | ORAL | Status: DC
  Administered 2012-06-19: 400 mg via ORAL
  Filled 2012-06-19 (×2): qty 1

## 2012-06-19 MED ORDER — INSULIN PEN STARTER KIT
1.0000 | Freq: Once | Status: DC
  Filled 2012-06-19: qty 1

## 2012-06-19 MED ORDER — METFORMIN HCL 500 MG PO TABS: 500.0000 mg | ORAL_TABLET | Freq: Two times a day (BID) | ORAL | Status: DC

## 2012-06-19 MED ORDER — PANTOPRAZOLE SODIUM 40 MG PO TBEC: 40.0000 mg | DELAYED_RELEASE_TABLET | Freq: Every day | ORAL | Status: DC

## 2012-06-19 MED ORDER — POTASSIUM CHLORIDE CRYS ER 20 MEQ PO TBCR
40.0000 meq | EXTENDED_RELEASE_TABLET | Freq: Three times a day (TID) | ORAL | Status: DC
  Administered 2012-06-19: 40 meq via ORAL
  Filled 2012-06-19: qty 2

## 2012-06-19 MED ORDER — METHOTREXATE SODIUM CHEMO INJECTION 25 MG/ML
25.0000 mg | INTRAMUSCULAR | Status: DC
  Administered 2012-06-19: 25 mg via SUBCUTANEOUS

## 2012-06-19 MED ORDER — INSULIN ASPART 100 UNIT/ML ~~LOC~~ SOLN
0.0000 [IU] | Freq: Three times a day (TID) | SUBCUTANEOUS | Status: DC
  Administered 2012-06-19: 3 [IU] via SUBCUTANEOUS
  Administered 2012-06-19: 2 [IU] via SUBCUTANEOUS
  Administered 2012-06-19: 3 [IU] via SUBCUTANEOUS

## 2012-06-19 MED ORDER — HYDROCHLOROTHIAZIDE 25 MG PO TABS: 25.0000 mg | ORAL_TABLET | Freq: Every day | ORAL | Status: AC

## 2012-06-19 MED ORDER — POTASSIUM CHLORIDE 10 MEQ/100ML IV SOLN
10.0000 meq | INTRAVENOUS | Status: DC
  Filled 2012-06-19 (×5): qty 100

## 2012-06-19 MED ORDER — INSULIN ASPART 100 UNIT/ML ~~LOC~~ SOLN: 0.0000 [IU] | Freq: Every day | SUBCUTANEOUS | Status: DC

## 2012-06-19 MED ORDER — INSULIN GLARGINE 100 UNIT/ML ~~LOC~~ SOLN: 20.0000 [IU] | Freq: Every day | SUBCUTANEOUS | Status: DC

## 2012-06-19 NOTE — Discharge Summary (Signed)
Physician Discharge Summary  Melody Carpenter EAV:[id number] DOB: 03/03/1966 DOA: 06/18/2012  PCP: Nonnie Done., MD  Admit date: 06/18/2012 Discharge date: 06/19/2012  Time spent: 35 minutes  Recommendations for Outpatient Follow-up:  1. Follow up with PCP to 4 weeks. Here will see her her blood sugar checks have been doing. And can transition her off Lantus, increased her metformin and even add glipizide.  Discharge Diagnoses:  Principal Problem:  *Hyperglycemic hyperosmolar nonketotic coma Active Problems:  SOB (shortness of breath)  Hyponatremia  Lactic acid acidosis   Discharge Condition: stable  Diet recommendation: carb modified diet  Filed Weights   06/18/12 2000  Weight: 87.952 kg (193 lb 14.4 oz)    History of present illness:  46 y.o. Carpenter  With past medical history of psoriatic arthritis, but started getting cough and shortness of breath one week prior to admission progressively getting worse. Went and saw her primary care doctor vitals were checked and she was tachycardic at that time, CBG was checked and showed a blood glucose greater than 600. She's also been complaining of some polydipsia and polyuria for the last for 5 days. She relates that her shortness of breath started about 5 days prior to admission with some cough. She is empirically treated with Levaquin one day prior to admission. Feeling weak and tired. She denies any fevers diarrhea, chest pain , denies abdominal pain.   Hospital Course:  Hyperglycemic hyperosmolar nonketotic coma - Admitted to the telemetry unit on IV insulin drip and aggressive IV fluid hydration to then transitioned to Lantus 15 units with a sliding scale. There were no triggering factors for this episode of hyperglycemia. She was continue on her moxifloxacin for possible acute bronchitis, her chest x-ray did not show any infiltrates even after hydration. She was also started on metformin twice a day. She was check CBGs a.c. and  at bedtime and followup with her PCP and to 4 weeks.  SOB (shortness of breath): - Resolved most likely secondary to tachycardia cardiac cardiac enzymes are negative x3.  Hyponatremia: - Pseudohyponatremia likely secondary to elevated blood glucose, resolved with correction of blood glucose.  Lactic acid acidosis: - Most likely secondary to decreased intravascular volume. Her blood pressure remained stable she was given 4 L of normal saline.  Hypokalemia: - Most likely secondary to decreased intravascular volume, her creatinine remained stable. She was repleted aggressively. Magnesium was checked which was low, this was also repleted aggressively.  Procedures:  None  Consultations:  None    Discharge Exam: Filed Vitals:   06/18/12 1838 06/18/12 1845 06/18/12 2000 06/19/12 0500  BP: 100/70 118/71 136/76 115/66  Pulse:  108 116 94  Temp:   98.5 F (36.9 C) 98 F (36.7 C)  TempSrc:   Oral Oral  Resp: 15 22 18 12   Height:   5\' 2"  (1.575 m)   Weight:   87.952 kg (193 lb 14.4 oz)   SpO2: 99% 97% 100% 97%    General: Awake alert and oriented x3 coherent for language Cardiovascular: Regular rate and rhythm no appreciated murmurs or gallops Respiratory: Good air movement clear to auscultation  Discharge Instructions  Discharge Orders    Future Orders Please Complete By Expires   Diet - low sodium heart healthy      Increase activity slowly          Medication List     As of 06/19/2012 10:01 AM    TAKE these medications         albuterol  108 (90 BASE) MCG/ACT inhaler   Commonly known as: PROVENTIL HFA;VENTOLIN HFA   Inhale 2 puffs into the lungs every 4 (four) hours as needed. For wheezing      etanercept 50 MG/ML injection   Commonly known as: ENBREL   Inject 25 mg into the skin 2 (two) times a week. Every Wednesday and Saturday      folic acid 1 MG tablet   Commonly known as: FOLVITE   Take 1 mg by mouth daily.      hydrochlorothiazide 25 MG tablet    Commonly known as: HYDRODIURIL   Take 1 tablet (25 mg total) by mouth daily.   Start taking on: 06/22/2012      insulin glargine 100 UNIT/ML injection   Commonly known as: LANTUS   Inject 15 Units into the skin at bedtime.      metFORMIN 500 MG tablet   Commonly known as: GLUCOPHAGE   Take 1 tablet (500 mg total) by mouth 2 (two) times daily with a meal.      methotrexate 25 MG/ML injection   Inject 25 mg into the vein once. Every Saturday      metoprolol succinate 50 MG 24 hr tablet   Commonly known as: TOPROL-XL   Take 50 mg by mouth daily. Take with or immediately following a meal.      moxifloxacin 400 MG tablet   Commonly known as: AVELOX   Take 400 mg by mouth daily. 10 day dose started 06-17-12 for URI      norethindrone 0.35 MG tablet   Commonly known as: MICRONOR,CAMILA,ERRIN   Take 1 tablet by mouth daily.      PARoxetine 20 MG tablet   Commonly known as: PAXIL   Take 20 mg by mouth daily.           Follow-up Information    Follow up with SLATOSKY,JOHN J., MD. In 3 weeks. (hospital follow up)           The results of significant diagnostics from this hospitalization (including imaging, microbiology, ancillary and laboratory) are listed below for reference.    Significant Diagnostic Studies: Dg Chest 2 View  06/19/2012  *RADIOLOGY REPORT*  Clinical Data: Shortness of breath.  CHEST - 2 VIEW  Comparison: 06/18/2012  Findings: Midline trachea.  Normal heart size and mediastinal contours.  Minimal right hemidiaphragm elevation. No pleural effusion or pneumothorax.  Clear lungs.  IMPRESSION: Normal chest.   Original Report Authenticated By: Jeronimo Greaves, M.D.    Dg Chest 2 View  06/18/2012  *RADIOLOGY REPORT*  Clinical Data: Cough, fever.  CHEST - 2 VIEW  Comparison: 06/17/2012  Findings: Heart and mediastinal contours are within normal limits. No focal opacities or effusions.  No acute bony abnormality.  IMPRESSION: No active cardiopulmonary disease.   Original  Report Authenticated By: Charlett Nose, M.D.     Microbiology: No results found for this or any previous visit (from the past 240 hour(s)).   Labs: Basic Metabolic Panel:  Lab 06/19/12 4098 06/19/12 0456 06/18/12 2100 06/18/12 1231  NA -- 136 -- 130*  K -- 2.2* -- 3.8  CL -- 98 -- 87*  CO2 -- 23 -- 21  GLUCOSE -- 158* -- 621*  BUN -- 9 -- 12  CREATININE -- 0.45* 0.69 0.47*  CALCIUM -- 8.5 -- 10.4  MG 1.1* -- -- --  PHOS -- -- -- --   Liver Function Tests:  Lab 06/19/12 0456  AST 23  ALT 23  ALKPHOS  61  BILITOT 0.4  PROT 5.8*  ALBUMIN 2.7*   No results found for this basename: LIPASE:5,AMYLASE:5 in the last 168 hours No results found for this basename: AMMONIA:5 in the last 168 hours CBC:  Lab 06/19/12 0456 06/18/12 2100 06/18/12 1231  WBC 7.4 9.7 10.0  NEUTROABS -- -- 7.1  HGB 12.5 13.2 15.5*  HCT 35.7* 37.1 43.5  MCV 89.9 89.4 89.0  PLT 199 209 254   Cardiac Enzymes: No results found for this basename: CKTOTAL:5,CKMB:5,CKMBINDEX:5,TROPONINI:5 in the last 168 hours BNP: BNP (last 3 results) No results found for this basename: PROBNP:3 in the last 8760 hours CBG:  Lab 06/19/12 0805 06/19/12 0405 06/19/12 0208 06/19/12 0111 06/19/12 0001  GLUCAP 192* 154* 137* 156* 148*       Signed:  FELIZ ORTIZ, Valdez Brannan  [facility] 06/19/2012, 10:01 AM

## 2012-06-22 NOTE — ED Provider Notes (Signed)
Medical screening examination/treatment/procedure(s) were performed by non-physician practitioner and as supervising physician I was immediately available for consultation/collaboration.  Denine Brotz, MD 06/22/12 1357 

## 2012-12-17 ENCOUNTER — Other Ambulatory Visit: Payer: Self-pay

## 2012-12-17 DIAGNOSIS — Z1231 Encounter for screening mammogram for malignant neoplasm of breast: Secondary | ICD-10-CM

## 2013-01-05 ENCOUNTER — Ambulatory Visit
Admission: RE | Admit: 2013-01-05 | Discharge: 2013-01-05 | Disposition: A | Discharge status: Home / Self Care | Payer: BC Managed Care – PPO | Source: Ambulatory Visit

## 2013-01-05 DIAGNOSIS — Z1231 Encounter for screening mammogram for malignant neoplasm of breast: Secondary | ICD-10-CM

## 2013-04-13 ENCOUNTER — Other Ambulatory Visit: Payer: Self-pay | Admitting: Nurse Practitioner

## 2013-04-13 DIAGNOSIS — M7989 Other specified soft tissue disorders: Secondary | ICD-10-CM

## 2013-04-27 ENCOUNTER — Ambulatory Visit
Admission: RE | Admit: 2013-04-27 | Discharge: 2013-04-27 | Disposition: A | Discharge status: Home / Self Care | Payer: BC Managed Care – PPO | Source: Ambulatory Visit | Attending: Nurse Practitioner | Admitting: Nurse Practitioner

## 2013-04-27 DIAGNOSIS — M7989 Other specified soft tissue disorders: Secondary | ICD-10-CM

## 2016-01-20 ENCOUNTER — Emergency Department (HOSPITAL_COMMUNITY)
Admission: EM | Admit: 2016-01-20 | Discharge: 2016-01-20 | Disposition: A | Discharge status: Home / Self Care | Payer: BC Managed Care – PPO | Attending: Emergency Medicine | Admitting: Emergency Medicine

## 2016-01-20 ENCOUNTER — Encounter (HOSPITAL_COMMUNITY): Payer: Self-pay

## 2016-01-20 ENCOUNTER — Emergency Department (HOSPITAL_COMMUNITY): Payer: BC Managed Care – PPO

## 2016-01-20 DIAGNOSIS — Z79899 Other long term (current) drug therapy: Secondary | ICD-10-CM | POA: Insufficient documentation

## 2016-01-20 DIAGNOSIS — R110 Nausea: Secondary | ICD-10-CM | POA: Diagnosis not present

## 2016-01-20 DIAGNOSIS — R519 Headache, unspecified: Secondary | ICD-10-CM

## 2016-01-20 DIAGNOSIS — Z7984 Long term (current) use of oral hypoglycemic drugs: Secondary | ICD-10-CM | POA: Diagnosis not present

## 2016-01-20 DIAGNOSIS — E119 Type 2 diabetes mellitus without complications: Secondary | ICD-10-CM | POA: Diagnosis not present

## 2016-01-20 DIAGNOSIS — R51 Headache: Secondary | ICD-10-CM | POA: Diagnosis present

## 2016-01-20 DIAGNOSIS — Z794 Long term (current) use of insulin: Secondary | ICD-10-CM | POA: Insufficient documentation

## 2016-01-20 MED ORDER — DEXAMETHASONE SODIUM PHOSPHATE 10 MG/ML IJ SOLN
10.0000 mg | Freq: Once | INTRAMUSCULAR | Status: AC
  Administered 2016-01-20: 10 mg via INTRAVENOUS
  Filled 2016-01-20: qty 1

## 2016-01-20 MED ORDER — DIPHENHYDRAMINE HCL 50 MG/ML IJ SOLN
25.0000 mg | Freq: Once | INTRAMUSCULAR | Status: AC
  Administered 2016-01-20: 25 mg via INTRAVENOUS
  Filled 2016-01-20: qty 1

## 2016-01-20 MED ORDER — SODIUM CHLORIDE 0.9 % IV BOLUS (SEPSIS)
1000.0000 mL | Freq: Once | INTRAVENOUS | Status: AC
  Administered 2016-01-20: 1000 mL via INTRAVENOUS

## 2016-01-20 MED ORDER — METOCLOPRAMIDE HCL 5 MG/ML IJ SOLN
10.0000 mg | Freq: Once | INTRAMUSCULAR | Status: AC
  Administered 2016-01-20: 10 mg via INTRAVENOUS
  Filled 2016-01-20: qty 2

## 2016-01-20 NOTE — ED Notes (Signed)
Patient transported to CT 

## 2016-01-20 NOTE — ED Provider Notes (Signed)
[facility]-EMERGENCY DEPT Provider Note   CSN: [id number] Arrival date & time: 01/20/16  1650  First Provider Contact:  First MD Initiated Contact with Patient 01/20/16 1847        History   Chief Complaint Chief Complaint  Patient presents with  . Migraine  . Nausea    HPI Melody Carpenter is a 50 y.o. female.  Patient presents to the ED with a chief complaint of headache.  She state that the headache started yesterday after lying down to take a nap.  She reports associated dizziness, nausea, and a heaviness sensation in her extremities.  She states that she had a migraine in her teens and states that this feels similar, but she has not had any other headaches like this.  She reports associate phonophobia and photophobia.  She denies any fevers, or chills.  She takes prednisone and methotrexate for psoriasis. She denies any other symptoms.  There are no other modifying factors.   The history is provided by the patient. No language interpreter was used.    Past Medical History:  Diagnosis Date  . Arthritis   . Arthritis with psoriasis [facility])     Patient Active Problem List   Diagnosis Date Noted  . Hyperglycemic hyperosmolar nonketotic coma (HCC) 06/18/2012  . SOB (shortness of breath) 06/18/2012  . Hyponatremia 06/18/2012  . Lactic acid acidosis 06/18/2012    History reviewed. No pertinent surgical history.  OB History    No data available       Home Medications    Prior to Admission medications   Medication Sig Start Date End Date Taking? Authorizing Provider  albuterol (PROVENTIL HFA;VENTOLIN HFA) 108 (90 BASE) MCG/ACT inhaler Inhale 2 puffs into the lungs every 4 (four) hours as needed. For wheezing    Historical Provider, MD  etanercept (ENBREL) 50 MG/ML injection Inject 25 mg into the skin 2 (two) times a week. Every Wednesday and Saturday    Historical Provider, MD  folic acid (FOLVITE) 1 MG tablet Take 1 mg by mouth daily.    Historical Provider, MD    hydrochlorothiazide (HYDRODIURIL) 25 MG tablet Take 1 tablet (25 mg total) by mouth daily. 06/22/12   Marinda Elk, MD  insulin glargine (LANTUS) 100 UNIT/ML injection Inject 15 Units into the skin at bedtime. 06/19/12   Marinda Elk, MD  metFORMIN (GLUCOPHAGE) 500 MG tablet Take 1 tablet (500 mg total) by mouth 2 (two) times daily with a meal. 06/19/12   Marinda Elk, MD  methotrexate 25 MG/ML injection Inject 25 mg into the vein once. Every Saturday    Historical Provider, MD  metoprolol succinate (TOPROL-XL) 50 MG 24 hr tablet Take 50 mg by mouth daily. Take with or immediately following a meal.    Historical Provider, MD  moxifloxacin (AVELOX) 400 MG tablet Take 400 mg by mouth daily. 10 day dose started 06-17-12 for URI    Historical Provider, MD  norethindrone (MICRONOR,CAMILA,ERRIN) 0.35 MG tablet Take 1 tablet by mouth daily.    Historical Provider, MD  PARoxetine (PAXIL) 20 MG tablet Take 20 mg by mouth daily.    Historical Provider, MD    Family History Family History  Problem Relation Age of Onset  . Hypertension Mother   . Diabetes Mellitus II Father     Social History Social History  Substance Use Topics  . Smoking status: Never Smoker  . Smokeless tobacco: Never Used  . Alcohol use No     Allergies   Review of  patient's allergies indicates no known allergies.   Review of Systems Review of Systems  Neurological: Positive for headaches.  All other systems reviewed and are negative.    Physical Exam Updated Vital Signs BP 121/76 (BP Location: Right Arm)   Pulse 78   Temp 98.2 F (36.8 C)   Resp 18   SpO2 96%   Physical Exam  Constitutional: She is oriented to person, place, and time. She appears well-developed and well-nourished.  HENT:  Head: Normocephalic and atraumatic.  Right Ear: External ear normal.  Left Ear: External ear normal.  Eyes: Conjunctivae and EOM are normal. Pupils are equal, round, and reactive to light.  Neck:  Normal range of motion. Neck supple.  No pain with neck flexion, no meningismus  Cardiovascular: Normal rate, regular rhythm and normal heart sounds.  Exam reveals no gallop and no friction rub.   No murmur heard. Pulmonary/Chest: Effort normal and breath sounds normal. No respiratory distress. She has no wheezes. She has no rales. She exhibits no tenderness.  Abdominal: Soft. She exhibits no distension and no mass. There is no tenderness. There is no rebound and no guarding.  Musculoskeletal: Normal range of motion. She exhibits no edema or tenderness.  Normal gait.  Neurological: She is alert and oriented to person, place, and time. She has normal reflexes.  CN 3-12 intact, normal finger to nose, no pronator drift, sensation and strength intact bilaterally.  Skin: Skin is warm and dry.  Psychiatric: She has a normal mood and affect. Her behavior is normal. Judgment and thought content normal.  Nursing note and vitals reviewed.    ED Treatments / Results  Labs (all labs ordered are listed, but only abnormal results are displayed) Labs Reviewed - No data to display  EKG  EKG Interpretation None       Radiology No results found.  Procedures Procedures (including critical care time)  Medications Ordered in ED Medications  sodium chloride 0.9 % bolus 1,000 mL (not administered)  dexamethasone (DECADRON) injection 10 mg (not administered)  diphenhydrAMINE (BENADRYL) injection 25 mg (not administered)  metoCLOPramide (REGLAN) injection 10 mg (not administered)     Initial Impression / Assessment and Plan / ED Course  I have reviewed the triage vital signs and the nursing notes.  Pertinent labs & imaging results that were available during my care of the patient were reviewed by me and considered in my medical decision making (see chart for details).  Clinical Course    Pt HA treated and improved while in ED.  Presentation is like pts non concerning for [facility], [facility],  Meningitis, or temporal arteritis. Pt is afebrile with no focal neuro deficits, nuchal rigidity, or change in vision. Pt is to follow up with PCP to discuss prophylactic medication. Pt verbalizes understanding and is agreeable with plan to dc.    Final Clinical Impressions(s) / ED Diagnoses   Final diagnoses:  Nonintractable headache, unspecified chronicity pattern, unspecified headache type    New Prescriptions New Prescriptions   No medications on file     Roxy Horseman, PA-C 01/20/16 2053    Gerhard Munch, MD 01/21/16 [phone]

## 2016-01-20 NOTE — ED Triage Notes (Signed)
Per pt, with headache since yesterday.  Light sensitivity.  Nausea.  Dizziness.  Pain better with cool cloth.  Pt is diabetic but cbg stable.  Hx of migraines.

## 2018-06-10 ENCOUNTER — Encounter: Payer: Medicare Other | Attending: Family Medicine | Admitting: Registered"

## 2018-06-10 ENCOUNTER — Encounter: Payer: Self-pay | Admitting: Registered"

## 2018-06-10 DIAGNOSIS — E1165 Type 2 diabetes mellitus with hyperglycemia: Secondary | ICD-10-CM | POA: Diagnosis present

## 2018-06-10 NOTE — Progress Notes (Signed)
Diabetes Self-Management Education  Visit Type: First/Initial  Appt. Start Time: 1035 Appt. End Time: 1145  06/10/2018  Ms. Melody Carpenter, identified by name and date of birth, is a 52 y.o. female with a diagnosis of Diabetes: Type 2.   ASSESSMENT Pt states she has had psoriatic arthritis for a long time and long-term use of prednisone. Pt reports she was diagnosed with T2DM at hospital admittance in 2014 with a BG of 799 mg/dL. Pt reports current FBG 80-119 mg/dL and states she peaks at the high end when having with a psarotic arthritis flair, the rest of the day pretty much stays in the low to mid 200s.  PT states she stays very busy/activing raising 403 yr old grandson. Pt states she exercises on the 2 days when grandson at preschool.   Physical activity:-walks outside or on treadmill at a rate of  2 mi hour with goal to increase time.  Sleep: does not sleep well, psoriasis itching, back pain, tossing turning. Stress: 5/10  Diabetes Self-Management Education - 06/10/18 1041      Visit Information   Visit Type  First/Initial      Initial Visit   Diabetes Type  Type 2    Are you currently following a meal plan?  No    Are you taking your medications as prescribed?  Yes   1000 mg metformin, lantus 10 units   Date Diagnosed  January 2014      Health Coping   How would you rate your overall health?  Poor      Psychosocial Assessment   Patient Belief/Attitude about Diabetes  Motivated to manage diabetes    How often do you need to have someone help you when you read instructions, pamphlets, or other written materials from your doctor or pharmacy?  1 - Never    What is the last grade level you completed in school?  masters degree      Complications   Last HgB A1C per patient/outside source  7.1 %    How often do you check your blood sugar?  1-2 times/day    Fasting Blood glucose range (mg/dL)  [phone]   [phone]   Postprandial Blood glucose range (mg/dL)  >295>200   [phone]   Number of hypoglycemic episodes per month  0    Number of hyperglycemic episodes per week  --   over 200 after meals   Have you had a dilated eye exam in the past 12 months?  Yes    Have you had a dental exam in the past 12 months?  Yes    Are you checking your feet?  Yes    How many days per week are you checking your feet?  7      Dietary Intake   Breakfast  cereal OR 1/2 banana    Snack (morning)  crackers, cheese    Lunch  sandwich, salad, fruit OR left overs    Snack (afternoon)  none    Dinner  black-eyed peas, corn, rice, chicken    Snack (evening)  PB with medication    Beverage(s)  1 or 2% milk, water, coffee      Exercise   Exercise Type  Light (walking / raking leaves)    How many days per week to you exercise?  2    How many minutes per day do you exercise?  30    Total minutes per week of exercise  60      Patient Education  Previous Diabetes Education  No    Disease state   Definition of diabetes, type 1 and 2, and the diagnosis of diabetes;Factors that contribute to the development of diabetes    Nutrition management   Role of diet in the treatment of diabetes and the relationship between the three main macronutrients and blood glucose level;Food label reading, portion sizes and measuring food.    Physical activity and exercise   Role of exercise on diabetes management, blood pressure control and cardiac health.    Medications  Reviewed patients medication for diabetes, action, purpose, timing of dose and side effects.    Monitoring  Identified appropriate SMBG and/or A1C goals.    Psychosocial adjustment  Role of stress on diabetes;Other (comment)   sleep     Individualized Goals (developed by patient)   Nutrition  General guidelines for healthy choices and portions discussed    Monitoring   test my blood glucose as discussed      Outcomes   Expected Outcomes  Demonstrated interest in learning. Expect positive outcomes    Future DMSE  PRN    Program Status   Completed      Individualized Plan for Diabetes Self-Management Training:   Learning Objective:  Patient will have a greater understanding of diabetes self-management. Patient education plan is to attend individual and/or group sessions per assessed needs and concerns.    Patient Instructions  Aim to eat balanced meals and snacks, somewhere between 30-45 grams carbs per meal. Consider changing your main meal of the day to lunch instead of dinner Consider checking your blood sugar more often for a few days to find patterns and keep a log of food, exercise, sleep patterns. You may want to talk to your doctor about a 2-week Continuous Glucose Monitor If your blood sugar starts coming down before bedtime, may want to have a small balanced snack to prevent low blood sugar during the night Consider trying some of the sleep tips. As you have more energy, consider adding in more days of physical activity.   Expected Outcomes:  Demonstrated interest in learning. Expect positive outcomes  Education material provided: ADA Diabetes: Your Take Control Guide, A1C conversion sheet, Snack sheet and Carbohydrate counting sheet, sleep hygiene  If problems or questions, patient to contact team via:  Phone  Future DSME appointment: PRN

## 2018-06-10 NOTE — Patient Instructions (Addendum)
Aim to eat balanced meals and snacks, somewhere between 30-45 grams carbs per meal. Consider changing your main meal of the day to lunch instead of dinner Consider checking your blood sugar more often for a few days to find patterns and keep a log of food, exercise, sleep patterns. You may want to talk to your doctor about a 2-week Continuous Glucose Monitor If your blood sugar starts coming down before bedtime, may want to have a small balanced snack to prevent low blood sugar during the night Consider trying some of the sleep tips. As you have more energy, consider adding in more days of physical activity.

## 2019-06-13 ENCOUNTER — Emergency Department (HOSPITAL_COMMUNITY): Payer: Medicare Other

## 2019-06-13 ENCOUNTER — Emergency Department (HOSPITAL_COMMUNITY)
Admission: EM | Admit: 2019-06-13 | Discharge: 2019-06-13 | Disposition: A | Discharge status: Home / Self Care | Payer: Medicare Other | Attending: Emergency Medicine | Admitting: Emergency Medicine

## 2019-06-13 ENCOUNTER — Other Ambulatory Visit: Payer: Self-pay

## 2019-06-13 DIAGNOSIS — J189 Pneumonia, unspecified organism: Secondary | ICD-10-CM | POA: Diagnosis not present

## 2019-06-13 DIAGNOSIS — Z7984 Long term (current) use of oral hypoglycemic drugs: Secondary | ICD-10-CM | POA: Diagnosis not present

## 2019-06-13 DIAGNOSIS — E119 Type 2 diabetes mellitus without complications: Secondary | ICD-10-CM | POA: Diagnosis not present

## 2019-06-13 DIAGNOSIS — M7918 Myalgia, other site: Secondary | ICD-10-CM | POA: Insufficient documentation

## 2019-06-13 DIAGNOSIS — R05 Cough: Secondary | ICD-10-CM | POA: Insufficient documentation

## 2019-06-13 DIAGNOSIS — J1282 Pneumonia due to coronavirus disease 2019: Secondary | ICD-10-CM

## 2019-06-13 DIAGNOSIS — R519 Headache, unspecified: Secondary | ICD-10-CM | POA: Insufficient documentation

## 2019-06-13 DIAGNOSIS — R0981 Nasal congestion: Secondary | ICD-10-CM | POA: Diagnosis not present

## 2019-06-13 DIAGNOSIS — R0602 Shortness of breath: Secondary | ICD-10-CM | POA: Diagnosis present

## 2019-06-13 DIAGNOSIS — U071 COVID-19: Secondary | ICD-10-CM | POA: Diagnosis not present

## 2019-06-13 LAB — CBC WITH DIFFERENTIAL/PLATELET
Abs Immature Granulocytes: 0.04 10*3/uL (ref 0.00–0.07)
Basophils Absolute: 0 10*3/uL (ref 0.0–0.1)
Basophils Relative: 0 %
Eosinophils Absolute: 0.1 10*3/uL (ref 0.0–0.5)
Eosinophils Relative: 1 %
HCT: 45 % (ref 36.0–46.0)
Hemoglobin: 15 g/dL (ref 12.0–15.0)
Immature Granulocytes: 1 %
Lymphocytes Relative: 11 %
Lymphs Abs: 0.9 10*3/uL (ref 0.7–4.0)
MCH: 30.1 pg (ref 26.0–34.0)
MCHC: 33.3 g/dL (ref 30.0–36.0)
MCV: 90.4 fL (ref 80.0–100.0)
Monocytes Absolute: 0.5 10*3/uL (ref 0.1–1.0)
Monocytes Relative: 7 %
Neutro Abs: 6.6 10*3/uL (ref 1.7–7.7)
Neutrophils Relative %: 80 %
Platelets: 173 10*3/uL (ref 150–400)
RBC: 4.98 MIL/uL (ref 3.87–5.11)
RDW: 14 % (ref 11.5–15.5)
WBC: 8.1 10*3/uL (ref 4.0–10.5)
nRBC: 0 % (ref 0.0–0.2)

## 2019-06-13 LAB — COMPREHENSIVE METABOLIC PANEL
ALT: 26 U/L (ref 0–44)
AST: 29 U/L (ref 15–41)
Albumin: 3.7 g/dL (ref 3.5–5.0)
Alkaline Phosphatase: 48 U/L (ref 38–126)
Anion gap: 17 — ABNORMAL HIGH (ref 5–15)
BUN: 19 mg/dL (ref 6–20)
CO2: 24 mmol/L (ref 22–32)
Calcium: 9.5 mg/dL (ref 8.9–10.3)
Chloride: 97 mmol/L — ABNORMAL LOW (ref 98–111)
Creatinine, Ser: 0.97 mg/dL (ref 0.44–1.00)
GFR calc Af Amer: >60 mL/min (ref >60)
GFR calc non Af Amer: >60 mL/min (ref >60)
Glucose, Bld: 175 mg/dL — ABNORMAL HIGH (ref 70–99)
Potassium: 3.4 mmol/L — ABNORMAL LOW (ref 3.5–5.1)
Sodium: 138 mmol/L (ref 135–145)
Total Bilirubin: 0.7 mg/dL (ref 0.3–1.2)
Total Protein: 7.3 g/dL (ref 6.5–8.1)

## 2019-06-13 MED ORDER — ONDANSETRON 4 MG PO TBDP: ORAL_TABLET | ORAL | 0 refills | Status: DC

## 2019-06-13 MED ORDER — ACETAMINOPHEN 500 MG PO TABS
1000.0000 mg | ORAL_TABLET | Freq: Once | ORAL | Status: AC
  Administered 2019-06-13: 11:00:00 1000 mg via ORAL
  Filled 2019-06-13: qty 2

## 2019-06-13 MED ORDER — KETOROLAC TROMETHAMINE 30 MG/ML IJ SOLN
30.0000 mg | Freq: Once | INTRAMUSCULAR | Status: AC
  Administered 2019-06-13: 11:00:00 30 mg via INTRAVENOUS
  Filled 2019-06-13: qty 1

## 2019-06-13 MED ORDER — BENZONATATE 100 MG PO CAPS: 100.0000 mg | ORAL_CAPSULE | Freq: Three times a day (TID) | ORAL | 0 refills | Status: DC | PRN

## 2019-06-13 MED ORDER — SODIUM CHLORIDE 0.9 % IV BOLUS
1000.0000 mL | Freq: Once | INTRAVENOUS | Status: AC
  Administered 2019-06-13: 11:00:00 1000 mL via INTRAVENOUS

## 2019-06-13 NOTE — ED Provider Notes (Signed)
[facility]-EMERGENCY DEPT Provider Note   CSN: [id number] Arrival date & time: 06/13/19  6967     History Chief Complaint  Patient presents with  . COVID positive  . Shortness of Breath  . Headache  . Diarrhea    Melody Carpenter is a 53 y.o. female.  HPI    Patient meets with 1 week of nasal congestion, sinus pressure, headache, body aches, nonproductive cough and diarrhea.  Had Covid test yesterday that was positive.  States this morning she began having some dyspnea.  She was concerned she may have developed pneumonia or dehydration.  Denies nausea or vomiting. Past Medical History:  Diagnosis Date  . Arthritis   . Arthritis with psoriasis [facility])     Patient Active Problem List   Diagnosis Date Noted  . Uncontrolled type 2 diabetes mellitus with hyperglycemia (HCC) 06/10/2018  . Hyperglycemic hyperosmolar nonketotic coma (HCC) 06/18/2012  . SOB (shortness of breath) 06/18/2012  . Hyponatremia 06/18/2012  . Lactic acid acidosis 06/18/2012    No past surgical history on file.   OB History   No obstetric history on file.     Family History  Problem Relation Age of Onset  . Hypertension Mother   . Diabetes Mellitus II Father     Social History   Tobacco Use  . Smoking status: Never Smoker  . Smokeless tobacco: Never Used  Substance Use Topics  . Alcohol use: No  . Drug use: No    Home Medications Prior to Admission medications   Medication Sig Start Date End Date Taking? Authorizing Provider  acetaminophen (TYLENOL) 500 MG tablet Take 1,000 mg by mouth every 6 (six) hours as needed for mild pain, moderate pain or headache.   Yes [provider]  albuterol (PROVENTIL HFA;VENTOLIN HFA) 108 (90 BASE) MCG/ACT inhaler Inhale 2 puffs into the lungs every 4 (four) hours as needed for wheezing or shortness of breath.    Yes [provider]  atorvastatin (LIPITOR) 10 MG tablet Take 10 mg by mouth at bedtime.   Yes [provider]  folic acid (FOLVITE) 1 MG tablet Take 1 mg by mouth daily.   Yes [provider]  glipiZIDE (GLUCOTROL XL) 5 MG 24 hr tablet Take 10 mg by mouth 2 (two) times daily.    Yes [provider]  hydrochlorothiazide (HYDRODIURIL) 25 MG tablet Take 1 tablet (25 mg total) by mouth daily. 06/22/12  Yes Marinda Elk, MD  insulin detemir (LEVEMIR) 100 UNIT/ML injection Inject 10 Units into the skin every evening.    Yes [provider]  latanoprost (XALATAN) 0.005 % ophthalmic solution Place 1 drop into both eyes at bedtime.    Yes [provider]  lisinopril (PRINIVIL,ZESTRIL) 5 MG tablet Take 5 mg by mouth daily.   Yes [provider]  metFORMIN (GLUCOPHAGE-XR) 500 MG 24 hr tablet Take 1,000 mg by mouth 2 (two) times daily with a meal.    Yes [provider]  methotrexate 25 MG/ML injection Inject 25 mg into the vein once a week. Pt uses on Friday.   Yes [provider]  metoprolol succinate (TOPROL-XL) 100 MG 24 hr tablet Take 100 mg by mouth daily. Take with or immediately following a meal.   Yes [provider]  PARoxetine (PAXIL) 10 MG tablet Take 10 mg by mouth daily.   Yes [provider]  predniSONE (DELTASONE) 5 MG tablet Take 20 mg by mouth daily with breakfast.   Yes  [provider]  benzonatate (TESSALON) 100 MG capsule Take 1 capsule (100 mg total) by mouth 3 (three) times daily as needed for cough. 06/13/19   Loren RacerYelverton, Vuong Musa, MD  ondansetron (ZOFRAN ODT) 4 MG disintegrating tablet 4mg  ODT q4 hours prn nausea/vomit 06/13/19   Loren RacerYelverton, Gaven Eugene, MD    Allergies    Patient has no known allergies.  Review of Systems   Review of Systems  Constitutional: Positive for activity change and fatigue.  HENT: Positive for congestion and sinus pressure.   Eyes: Negative for pain and visual disturbance.  Respiratory: Positive for cough and shortness of breath. Negative for wheezing.     Cardiovascular: Negative for chest pain, palpitations and leg swelling.  Gastrointestinal: Positive for diarrhea. Negative for abdominal pain, constipation, nausea and vomiting.  Genitourinary: Negative for dysuria, flank pain and frequency.  Musculoskeletal: Positive for myalgias. Negative for neck pain and neck stiffness.  Skin: Negative for rash and wound.  Neurological: Positive for headaches. Negative for dizziness, weakness, light-headedness and numbness.  All other systems reviewed and are negative.   Physical Exam Updated Vital Signs BP 134/77   Pulse 89   Temp 98.1 F (36.7 C) (Oral)   Resp 19   SpO2 94%   Physical Exam Vitals and nursing note reviewed.  Constitutional:      General: She is not in acute distress.    Appearance: Normal appearance. She is well-developed. She is not ill-appearing.  HENT:     Head: Normocephalic and atraumatic.     Nose: Nose normal.     Mouth/Throat:     Mouth: Mucous membranes are moist.  Eyes:     Extraocular Movements: Extraocular movements intact.     Pupils: Pupils are equal, round, and reactive to light.  Cardiovascular:     Rate and Rhythm: Regular rhythm. Tachycardia present.     Heart sounds: No murmur. No friction rub. No gallop.   Pulmonary:     Effort: Pulmonary effort is normal. No respiratory distress.     Breath sounds: Normal breath sounds. No stridor. No wheezing, rhonchi or rales.  Chest:     Chest wall: No tenderness.  Abdominal:     General: Bowel sounds are normal.     Palpations: Abdomen is soft.     Tenderness: There is no abdominal tenderness. There is no guarding or rebound.  Musculoskeletal:        General: No swelling, tenderness, deformity or signs of injury. Normal range of motion.     Cervical back: Normal range of motion and neck supple. No rigidity or tenderness.     Right lower leg: No edema.     Left lower leg: No edema.  Lymphadenopathy:     Cervical: No cervical adenopathy.  Skin:     General: Skin is warm and dry.     Findings: No erythema or rash.  Neurological:     General: No focal deficit present.     Mental Status: She is alert and oriented to person, place, and time.  Psychiatric:        Mood and Affect: Mood normal.        Behavior: Behavior normal.     ED Results / Procedures / Treatments   Labs (all labs ordered are listed, but only abnormal results are displayed) Labs Reviewed  COMPREHENSIVE METABOLIC PANEL - Abnormal; Notable for the following components:      Result Value   Potassium 3.4 (*)    Chloride 97 (*)  Glucose, Bld 175 (*)    Anion gap 17 (*)    All other components within normal limits  CBC WITH DIFFERENTIAL/PLATELET    EKG None  Radiology DG Chest Port 1 View  Result Date: 06/13/2019 CLINICAL DATA:  53 year old female with shortness of breath. Positive for COVID-19. EXAM: PORTABLE CHEST 1 VIEW COMPARISON:  Chest radiographs 06/19/2012 and earlier. FINDINGS: Portable AP semi upright view at 1042 hours. Lower lung volumes. Mediastinal contours remain normal. Visualized tracheal air column is within normal limits. Questionable asymmetric interstitial opacity in the periphery of both lower lungs. Elsewhere allowing for portable technique both lungs appear clear. Paucity of bowel gas in the upper abdomen. No acute osseous abnormality identified. IMPRESSION: Lower lung volumes with questionable mild peripheral interstitial opacity in the lower lungs raising the possibility of COVID-19 pneumonia. Electronically Signed   By: Genevie Ann M.D.   On: 06/13/2019 11:27    Procedures Procedures (including critical care time)  Medications Ordered in ED Medications  sodium chloride 0.9 % bolus 1,000 mL (1,000 mLs Intravenous New Bag/Given 06/13/19 1038)  ketorolac (TORADOL) 30 MG/ML injection 30 mg (30 mg Intravenous Given 06/13/19 1036)  acetaminophen (TYLENOL) tablet 1,000 mg (1,000 mg Oral Given 06/13/19 1036)    ED Course  I have reviewed  the triage vital signs and the nursing notes.  Pertinent labs & imaging results that were available during my care of the patient were reviewed by me and considered in my medical decision making (see chart for details).    MDM Rules/Calculators/A&P                       Patient states she is feeling much better after medication and fluids.  Maintaining saturations in the mid 90s.  X-ray consistent with Covid pneumonia.  Return precautions given.  Final Clinical Impression(s) / ED Diagnoses Final diagnoses:  Pneumonia due to COVID-19 virus    Rx / DC Orders ED Discharge Orders         Ordered    benzonatate (TESSALON) 100 MG capsule  3 times daily PRN     06/13/19 1352    ondansetron (ZOFRAN ODT) 4 MG disintegrating tablet     06/13/19 1352           Julianne Rice, MD 06/13/19 1355

## 2019-06-13 NOTE — ED Triage Notes (Signed)
Pt from home.  Pt began having cold-like symptoms last Monday.  Pt tested positive for COVID yesterday at [facility].  Pt began having SOB this am.  Pt also has c/o of H/A, diarrhea, body aches, and cough.

## 2020-11-07 DIAGNOSIS — M47816 Spondylosis without myelopathy or radiculopathy, lumbar region: Secondary | ICD-10-CM | POA: Insufficient documentation

## 2020-11-10 ENCOUNTER — Encounter (HOSPITAL_COMMUNITY): Payer: Self-pay

## 2020-11-10 ENCOUNTER — Emergency Department (HOSPITAL_COMMUNITY): Payer: Medicare Other

## 2020-11-10 ENCOUNTER — Other Ambulatory Visit: Payer: Self-pay

## 2020-11-10 ENCOUNTER — Inpatient Hospital Stay (HOSPITAL_COMMUNITY)
Admission: EM | Admit: 2020-11-10 | Discharge: 2020-11-14 | DRG: 392 | Disposition: A | Discharge status: Home / Self Care | Payer: Medicare Other | Attending: Internal Medicine | Admitting: Internal Medicine

## 2020-11-10 DIAGNOSIS — E860 Dehydration: Secondary | ICD-10-CM | POA: Diagnosis present

## 2020-11-10 DIAGNOSIS — E242 Drug-induced Cushing's syndrome: Secondary | ICD-10-CM | POA: Diagnosis present

## 2020-11-10 DIAGNOSIS — L4050 Arthropathic psoriasis, unspecified: Secondary | ICD-10-CM | POA: Diagnosis present

## 2020-11-10 DIAGNOSIS — F32A Depression, unspecified: Secondary | ICD-10-CM | POA: Diagnosis present

## 2020-11-10 DIAGNOSIS — Z8249 Family history of ischemic heart disease and other diseases of the circulatory system: Secondary | ICD-10-CM

## 2020-11-10 DIAGNOSIS — I10 Essential (primary) hypertension: Secondary | ICD-10-CM | POA: Diagnosis present

## 2020-11-10 DIAGNOSIS — M549 Dorsalgia, unspecified: Secondary | ICD-10-CM | POA: Diagnosis not present

## 2020-11-10 DIAGNOSIS — E873 Alkalosis: Secondary | ICD-10-CM | POA: Diagnosis present

## 2020-11-10 DIAGNOSIS — T380X5A Adverse effect of glucocorticoids and synthetic analogues, initial encounter: Secondary | ICD-10-CM | POA: Diagnosis present

## 2020-11-10 DIAGNOSIS — E1165 Type 2 diabetes mellitus with hyperglycemia: Secondary | ICD-10-CM | POA: Diagnosis present

## 2020-11-10 DIAGNOSIS — R630 Anorexia: Secondary | ICD-10-CM | POA: Diagnosis present

## 2020-11-10 DIAGNOSIS — E042 Nontoxic multinodular goiter: Secondary | ICD-10-CM | POA: Diagnosis present

## 2020-11-10 DIAGNOSIS — Z79899 Other long term (current) drug therapy: Secondary | ICD-10-CM

## 2020-11-10 DIAGNOSIS — Z6834 Body mass index (BMI) 34.0-34.9, adult: Secondary | ICD-10-CM | POA: Diagnosis not present

## 2020-11-10 DIAGNOSIS — K760 Fatty (change of) liver, not elsewhere classified: Secondary | ICD-10-CM | POA: Diagnosis present

## 2020-11-10 DIAGNOSIS — R634 Abnormal weight loss: Secondary | ICD-10-CM | POA: Diagnosis present

## 2020-11-10 DIAGNOSIS — R112 Nausea with vomiting, unspecified: Secondary | ICD-10-CM | POA: Diagnosis present

## 2020-11-10 DIAGNOSIS — G8929 Other chronic pain: Secondary | ICD-10-CM | POA: Diagnosis not present

## 2020-11-10 DIAGNOSIS — Z20822 Contact with and (suspected) exposure to covid-19: Secondary | ICD-10-CM | POA: Diagnosis present

## 2020-11-10 DIAGNOSIS — Z808 Family history of malignant neoplasm of other organs or systems: Secondary | ICD-10-CM

## 2020-11-10 DIAGNOSIS — Z794 Long term (current) use of insulin: Secondary | ICD-10-CM

## 2020-11-10 DIAGNOSIS — R918 Other nonspecific abnormal finding of lung field: Secondary | ICD-10-CM | POA: Diagnosis present

## 2020-11-10 DIAGNOSIS — E785 Hyperlipidemia, unspecified: Secondary | ICD-10-CM | POA: Diagnosis present

## 2020-11-10 DIAGNOSIS — K2970 Gastritis, unspecified, without bleeding: Secondary | ICD-10-CM

## 2020-11-10 DIAGNOSIS — M546 Pain in thoracic spine: Secondary | ICD-10-CM | POA: Diagnosis present

## 2020-11-10 DIAGNOSIS — Z833 Family history of diabetes mellitus: Secondary | ICD-10-CM

## 2020-11-10 DIAGNOSIS — Z7984 Long term (current) use of oral hypoglycemic drugs: Secondary | ICD-10-CM

## 2020-11-10 DIAGNOSIS — K2900 Acute gastritis without bleeding: Secondary | ICD-10-CM | POA: Diagnosis not present

## 2020-11-10 DIAGNOSIS — R000 Tachycardia, unspecified: Secondary | ICD-10-CM | POA: Insufficient documentation

## 2020-11-10 DIAGNOSIS — M545 Low back pain, unspecified: Secondary | ICD-10-CM | POA: Diagnosis not present

## 2020-11-10 DIAGNOSIS — R1310 Dysphagia, unspecified: Secondary | ICD-10-CM | POA: Diagnosis present

## 2020-11-10 DIAGNOSIS — M62838 Other muscle spasm: Secondary | ICD-10-CM | POA: Diagnosis present

## 2020-11-10 DIAGNOSIS — R509 Fever, unspecified: Secondary | ICD-10-CM | POA: Diagnosis present

## 2020-11-10 DIAGNOSIS — M48061 Spinal stenosis, lumbar region without neurogenic claudication: Secondary | ICD-10-CM | POA: Diagnosis present

## 2020-11-10 DIAGNOSIS — N179 Acute kidney failure, unspecified: Secondary | ICD-10-CM | POA: Diagnosis present

## 2020-11-10 HISTORY — DX: Type 2 diabetes mellitus without complications: E11.9

## 2020-11-10 LAB — CBC WITH DIFFERENTIAL/PLATELET
Abs Immature Granulocytes: 0.01 10*3/uL (ref 0.00–0.07)
Basophils Absolute: 0.1 10*3/uL (ref 0.0–0.1)
Basophils Relative: 2 %
Eosinophils Absolute: 0.2 10*3/uL (ref 0.0–0.5)
Eosinophils Relative: 3 %
HCT: 41.1 % (ref 36.0–46.0)
Hemoglobin: 13.7 g/dL (ref 12.0–15.0)
Immature Granulocytes: 0 %
Lymphocytes Relative: 31 %
Lymphs Abs: 1.9 10*3/uL (ref 0.7–4.0)
MCH: 28.5 pg (ref 26.0–34.0)
MCHC: 33.3 g/dL (ref 30.0–36.0)
MCV: 85.6 fL (ref 80.0–100.0)
Monocytes Absolute: 0.9 10*3/uL (ref 0.1–1.0)
Monocytes Relative: 14 %
Neutro Abs: 3.1 10*3/uL (ref 1.7–7.7)
Neutrophils Relative %: 50 %
Platelets: 291 10*3/uL (ref 150–400)
RBC: 4.8 MIL/uL (ref 3.87–5.11)
RDW: 13.3 % (ref 11.5–15.5)
WBC: 6.1 10*3/uL (ref 4.0–10.5)
nRBC: 0 % (ref 0.0–0.2)

## 2020-11-10 LAB — COMPREHENSIVE METABOLIC PANEL
ALT: 20 U/L (ref 0–44)
AST: 46 U/L — ABNORMAL HIGH (ref 15–41)
Albumin: 4.4 g/dL (ref 3.5–5.0)
Alkaline Phosphatase: 53 U/L (ref 38–126)
Anion gap: 19 — ABNORMAL HIGH (ref 5–15)
BUN: 18 mg/dL (ref 6–20)
CO2: 21 mmol/L — ABNORMAL LOW (ref 22–32)
Calcium: 11.4 mg/dL — ABNORMAL HIGH (ref 8.9–10.3)
Chloride: 96 mmol/L — ABNORMAL LOW (ref 98–111)
Creatinine, Ser: 1.55 mg/dL — ABNORMAL HIGH (ref 0.44–1.00)
GFR, Estimated: 40 mL/min — ABNORMAL LOW (ref >60)
Glucose, Bld: 174 mg/dL — ABNORMAL HIGH (ref 70–99)
Potassium: 4.2 mmol/L (ref 3.5–5.1)
Sodium: 136 mmol/L (ref 135–145)
Total Bilirubin: 1.8 mg/dL — ABNORMAL HIGH (ref 0.3–1.2)
Total Protein: 7.5 g/dL (ref 6.5–8.1)

## 2020-11-10 LAB — URINALYSIS, ROUTINE W REFLEX MICROSCOPIC
Bilirubin Urine: NEGATIVE
Glucose, UA: NEGATIVE mg/dL
Ketones, ur: 80 mg/dL — AB
Nitrite: NEGATIVE
Protein, ur: NEGATIVE mg/dL
Specific Gravity, Urine: 1.041 — ABNORMAL HIGH (ref 1.005–1.030)
pH: 5 (ref 5.0–8.0)

## 2020-11-10 LAB — LACTIC ACID, PLASMA
Lactic Acid, Venous: 2.2 mmol/L (ref 0.5–1.9)
Lactic Acid, Venous: 1.5 mmol/L (ref 0.5–1.9)

## 2020-11-10 LAB — RESP PANEL BY RT-PCR (FLU A&B, COVID) ARPGX2
Influenza A by PCR: NEGATIVE
Influenza B by PCR: NEGATIVE
SARS Coronavirus 2 by RT PCR: NEGATIVE

## 2020-11-10 LAB — TROPONIN I (HIGH SENSITIVITY)
Troponin I (High Sensitivity): 6 ng/L (ref <18)
Troponin I (High Sensitivity): 6 ng/L (ref <18)

## 2020-11-10 LAB — GLUCOSE, CAPILLARY: Glucose-Capillary: 135 mg/dL — ABNORMAL HIGH (ref 70–99)

## 2020-11-10 LAB — LIPASE, BLOOD: Lipase: 52 U/L — ABNORMAL HIGH (ref 11–51)

## 2020-11-10 LAB — BLOOD GAS, VENOUS
Acid-base deficit: 2.7 mmol/L — ABNORMAL HIGH (ref 0.0–2.0)
Bicarbonate: 21.5 mmol/L (ref 20.0–28.0)
FIO2: 21
O2 Saturation: 36.6 %
Patient temperature: 98.6
pCO2, Ven: 37.4 mmHg — ABNORMAL LOW (ref 44.0–60.0)
pH, Ven: 7.378 (ref 7.250–7.430)
pO2, Ven: <31 mmHg — CL (ref 32.0–45.0)

## 2020-11-10 LAB — MAGNESIUM: Magnesium: 1.1 mg/dL — ABNORMAL LOW (ref 1.7–2.4)

## 2020-11-10 LAB — TSH: TSH: 2.618 u[IU]/mL (ref 0.350–4.500)

## 2020-11-10 LAB — PHOSPHORUS: Phosphorus: 4.3 mg/dL (ref 2.5–4.6)

## 2020-11-10 MED ORDER — INSULIN ASPART 100 UNIT/ML IJ SOLN
0.0000 [IU] | Freq: Three times a day (TID) | INTRAMUSCULAR | Status: DC
  Administered 2020-11-11: 3 [IU] via SUBCUTANEOUS
  Administered 2020-11-11: 2 [IU] via SUBCUTANEOUS
  Administered 2020-11-12: 5 [IU] via SUBCUTANEOUS
  Administered 2020-11-12: 3 [IU] via SUBCUTANEOUS
  Administered 2020-11-12: 2 [IU] via SUBCUTANEOUS
  Administered 2020-11-13: 3 [IU] via SUBCUTANEOUS
  Administered 2020-11-13 (×2): 2 [IU] via SUBCUTANEOUS
  Administered 2020-11-14 (×2): 3 [IU] via SUBCUTANEOUS

## 2020-11-10 MED ORDER — ONDANSETRON HCL 4 MG/2ML IJ SOLN: 4.0000 mg | Freq: Four times a day (QID) | INTRAMUSCULAR | Status: DC | PRN

## 2020-11-10 MED ORDER — FOLIC ACID 1 MG PO TABS
1.0000 mg | ORAL_TABLET | Freq: Every day | ORAL | Status: DC
  Administered 2020-11-10 – 2020-11-14 (×5): 1 mg via ORAL
  Filled 2020-11-10 (×5): qty 1

## 2020-11-10 MED ORDER — LATANOPROST 0.005 % OP SOLN
1.0000 [drp] | Freq: Every day | OPHTHALMIC | Status: DC
  Administered 2020-11-10 – 2020-11-13 (×4): 1 [drp] via OPHTHALMIC
  Filled 2020-11-10: qty 2.5

## 2020-11-10 MED ORDER — ONDANSETRON HCL 4 MG/2ML IJ SOLN
4.0000 mg | Freq: Once | INTRAMUSCULAR | Status: AC
  Administered 2020-11-10: 4 mg via INTRAVENOUS
  Filled 2020-11-10: qty 2

## 2020-11-10 MED ORDER — LACTATED RINGERS IV SOLN: INTRAVENOUS | Status: DC

## 2020-11-10 MED ORDER — ACETAMINOPHEN 325 MG PO TABS
650.0000 mg | ORAL_TABLET | Freq: Four times a day (QID) | ORAL | Status: DC | PRN
  Administered 2020-11-10 – 2020-11-11 (×2): 650 mg via ORAL
  Filled 2020-11-10 (×2): qty 2

## 2020-11-10 MED ORDER — ATORVASTATIN CALCIUM 10 MG PO TABS
10.0000 mg | ORAL_TABLET | Freq: Every day | ORAL | Status: DC
  Administered 2020-11-10 – 2020-11-13 (×4): 10 mg via ORAL
  Filled 2020-11-10 (×4): qty 1

## 2020-11-10 MED ORDER — ENSURE ENLIVE PO LIQD
237.0000 mL | Freq: Two times a day (BID) | ORAL | Status: DC
  Administered 2020-11-11 – 2020-11-14 (×6): 237 mL via ORAL

## 2020-11-10 MED ORDER — LISINOPRIL 5 MG PO TABS
5.0000 mg | ORAL_TABLET | Freq: Every day | ORAL | Status: DC
  Administered 2020-11-10 – 2020-11-11 (×2): 5 mg via ORAL
  Filled 2020-11-10 (×2): qty 1

## 2020-11-10 MED ORDER — PAROXETINE HCL 10 MG PO TABS
10.0000 mg | ORAL_TABLET | Freq: Every day | ORAL | Status: DC
  Administered 2020-11-10 – 2020-11-14 (×5): 10 mg via ORAL
  Filled 2020-11-10 (×5): qty 1

## 2020-11-10 MED ORDER — PIOGLITAZONE HCL 15 MG PO TABS
15.0000 mg | ORAL_TABLET | Freq: Every day | ORAL | Status: DC
  Administered 2020-11-11: 15 mg via ORAL
  Filled 2020-11-10: qty 1

## 2020-11-10 MED ORDER — PANTOPRAZOLE SODIUM 40 MG IV SOLR
40.0000 mg | Freq: Once | INTRAVENOUS | Status: AC
  Administered 2020-11-10: 40 mg via INTRAVENOUS
  Filled 2020-11-10: qty 40

## 2020-11-10 MED ORDER — HYDROCHLOROTHIAZIDE 25 MG PO TABS
25.0000 mg | ORAL_TABLET | Freq: Every day | ORAL | Status: DC
  Administered 2020-11-11: 25 mg via ORAL
  Filled 2020-11-10: qty 1

## 2020-11-10 MED ORDER — INSULIN ASPART 100 UNIT/ML IJ SOLN: 0.0000 [IU] | Freq: Every day | INTRAMUSCULAR | Status: DC

## 2020-11-10 MED ORDER — METOPROLOL SUCCINATE ER 50 MG PO TB24
100.0000 mg | ORAL_TABLET | Freq: Two times a day (BID) | ORAL | Status: DC
  Administered 2020-11-10 – 2020-11-14 (×7): 100 mg via ORAL
  Filled 2020-11-10 (×8): qty 2

## 2020-11-10 MED ORDER — ENOXAPARIN SODIUM 40 MG/0.4ML IJ SOSY
40.0000 mg | PREFILLED_SYRINGE | INTRAMUSCULAR | Status: DC
  Administered 2020-11-10 – 2020-11-13 (×4): 40 mg via SUBCUTANEOUS
  Filled 2020-11-10 (×4): qty 0.4

## 2020-11-10 MED ORDER — LACTATED RINGERS IV BOLUS
1000.0000 mL | Freq: Once | INTRAVENOUS | Status: AC
  Administered 2020-11-10: 1000 mL via INTRAVENOUS

## 2020-11-10 MED ORDER — HYDROMORPHONE HCL 1 MG/ML IJ SOLN
1.0000 mg | Freq: Once | INTRAMUSCULAR | Status: AC
  Administered 2020-11-10: 1 mg via INTRAVENOUS
  Filled 2020-11-10: qty 1

## 2020-11-10 MED ORDER — INSULIN DETEMIR 100 UNIT/ML ~~LOC~~ SOLN
10.0000 [IU] | Freq: Every day | SUBCUTANEOUS | Status: DC
  Administered 2020-11-10: 10 [IU] via SUBCUTANEOUS
  Filled 2020-11-10 (×2): qty 0.1

## 2020-11-10 MED ORDER — ONDANSETRON HCL 4 MG PO TABS: 4.0000 mg | ORAL_TABLET | Freq: Four times a day (QID) | ORAL | Status: DC | PRN

## 2020-11-10 MED ORDER — ALBUTEROL SULFATE HFA 108 (90 BASE) MCG/ACT IN AERS
2.0000 | INHALATION_SPRAY | RESPIRATORY_TRACT | Status: DC | PRN
  Filled 2020-11-10: qty 6.7

## 2020-11-10 MED ORDER — IOHEXOL 350 MG/ML SOLN
80.0000 mL | Freq: Once | INTRAVENOUS | Status: AC | PRN
  Administered 2020-11-10: 75 mL via INTRAVENOUS

## 2020-11-10 MED ORDER — GLIPIZIDE ER 5 MG PO TB24
10.0000 mg | ORAL_TABLET | Freq: Two times a day (BID) | ORAL | Status: DC
  Administered 2020-11-10 – 2020-11-11 (×2): 10 mg via ORAL
  Filled 2020-11-10 (×2): qty 2

## 2020-11-10 NOTE — ED Notes (Signed)
Pt O2 levels noted to be 86% RA. Pt placed on 2L [place]

## 2020-11-10 NOTE — ED Triage Notes (Addendum)
Patient reports she has psoriatic arthritis and been on steroids for 30 years. Says doctors at [facility] stopped steriods via taper schedule two months ago. Patient says she is in pain all over body since stopping steroids. Patient says MD gave her tramadol for pain, wasn't working, MD instructed patient to take two. Patient says when she takes two she cannot eat. Pain rated 10/10. Patient says she has been vomiting x 5 days and lost 32 pounds in one month.

## 2020-11-10 NOTE — H&P (Signed)
History and Physical   Melody Carpenter ZOX:[id number] DOB: 1965/11/18 DOA: 11/10/2020  Referring MD/NP/PA: Dr. Clarice Carpenter  PCP: Melody DoneSlatosky, John J., MD   Outpatient Specialists: [facility]  Patient coming from: Home  Chief Complaint: Generalized weakness with tachycardia  HPI: Melody Carpenter is a 55 y.o. female with medical history significant of psoriasis, chronic steroid use status post month long tapering off, insulin-dependent diabetes type 2, psoriatic arthritis, iatrogenic Cushing's syndrome, who presents to the ER with intractable nausea and vomiting significant back pain as well as weakness.  Patient also reported loss of appetite which has been going on for the last 2 weeks.  She apparently was tapered off her prednisone about a month ago.  The prednisone was tapered off over 1 month.  2 weeks after that she started having loss of appetite.  Patient then started having nausea.  Now she is having vomiting and unable to keep anything down.  She denies any fever or chills.  Denied any sick contact.  Denied any diarrhea.  Patient also having significant 9 out of 10 pain in her back.  She apparently has some previous history of back pain with compression fracture.  No recent falls.  She was noted to have significant hypomagnesemia and persistent sinus tachycardia with a heart rate in the 140s.  At this point patient appears to have difficult dehydration, intractable nausea vomiting but no obvious signs of adrenal crisis.  ED Course: Temperature is 100.2, blood pressure 155/101, pulse 136, respiratory of 22 and oxygen sat 90% on room air.  COVID-19 pain influenza screen is negative.  Sodium is 136, potassium 4.2.  Chloride 96 with CO2 21 and glucose 174.  Creatinine is 1.55 and calcium 11.4.  TSH is 2.6.8.  Patient being admitted with intractable nausea vomiting.  Review of Systems: As per HPI otherwise 10 point review of systems negative.    Past Medical History:  Diagnosis Date  .  Arthritis   . Arthritis with psoriasis (HCC)   . Diabetes mellitus without complication (HCC)     No past surgical history on file.   reports that she has never smoked. She has never used smokeless tobacco. She reports that she does not drink alcohol and does not use drugs.  No Known Allergies  Family History  Problem Relation Age of Onset  . Hypertension Mother   . Diabetes Mellitus II Father      Prior to Admission medications   Medication Sig Start Date End Date Taking? Authorizing Provider  albuterol (PROVENTIL HFA;VENTOLIN HFA) 108 (90 BASE) MCG/ACT inhaler Inhale 2 puffs into the lungs every 4 (four) hours as needed for wheezing or shortness of breath.    Yes [provider]  atorvastatin (LIPITOR) 10 MG tablet Take 10 mg by mouth at bedtime.   Yes [provider]  folic acid (FOLVITE) 1 MG tablet Take 1 mg by mouth daily.   Yes [provider]  glipiZIDE (GLUCOTROL XL) 10 MG 24 hr tablet Take 10 mg by mouth 2 (two) times daily.   Yes [provider]  golimumab (SIMPONI ARIA) 50 MG/4ML SOLN injection Inject 2,500 mg into the vein every 8 (eight) weeks. Patient getting 200 ml every 8 weeks.   Yes [provider]  hydrochlorothiazide (HYDRODIURIL) 25 MG tablet Take 1 tablet (25 mg total) by mouth daily. 06/22/12  Yes Marinda ElkFeliz Ortiz, Abraham, MD  insulin detemir (LEVEMIR) 100 UNIT/ML injection Inject 10 Units into the skin every evening.    Yes [provider]  latanoprost (XALATAN) 0.005 % ophthalmic solution Place 1 drop into both eyes at bedtime.    Yes [provider]  lisinopril (PRINIVIL,ZESTRIL) 5 MG tablet Take 5 mg by mouth daily.   Yes [provider]  metFORMIN (GLUCOPHAGE-XR) 500 MG 24 hr tablet Take 1,000 mg by mouth 2 (two) times daily with a meal.    Yes [provider]  methotrexate 25 MG/ML injection Inject 25 mg into the vein once a week. Pt uses on Friday.   Yes [provider]   metoprolol succinate (TOPROL-XL) 100 MG 24 hr tablet Take 100 mg by mouth 2 (two) times daily.   Yes [provider]  PARoxetine (PAXIL) 10 MG tablet Take 10 mg by mouth daily.   Yes [provider]  pioglitazone (ACTOS) 15 MG tablet Take 15 mg by mouth daily.   Yes [provider]  benzonatate (TESSALON) 100 MG capsule Take 1 capsule (100 mg total) by mouth 3 (three) times daily as needed for cough. Patient not taking: No sig reported 06/13/19   Loren Racer, MD  ondansetron (ZOFRAN ODT) 4 MG disintegrating tablet  ODT q4 hours prn nausea/vomit Patient not taking: No sig reported 06/13/19   Loren Racer, MD    Physical Exam: Vitals:   11/10/20 1700 11/10/20 1730 11/10/20 1800 11/10/20 1936  BP: 134/81 132/82 (!) 151/96 131/84  Pulse: (!) 125 (!) 122 (!) 136 (!) 126  Resp: Temp:   99.7 F (37.6 C) 98.2 F (36.8 C)  TempSrc:   Oral Oral  SpO2: 93% 93% 99% 100%  Weight:    84.5 kg      Constitutional: Chronically ill looking, cushinoid, moon facies Vitals:   11/10/20 1700 11/10/20 1730 11/10/20 1800 11/10/20 1936  BP: 134/81 132/82 (!) 151/96 131/84  Pulse: (!) 125 (!) 122 (!) 136 (!) 126  Resp: Temp:   99.7 F (37.6 C) 98.2 F (36.8 C)  TempSrc:   Oral Oral  SpO2: 93% 93% 99% 100%  Weight:    84.5 kg   Eyes: PERRL, lids and conjunctivae normal ENMT: Mucous membranes are dry, posterior pharynx clear of any exudate or lesions.Normal dentition.  Neck: normal, supple, no masses, no thyromegaly Respiratory: clear to auscultation bilaterally, no wheezing, no crackles. Normal respiratory effort. No accessory muscle use.  Cardiovascular: Sinus tachycardia, no murmurs / rubs / gallops.  Trace extremity edema. 2+ pedal pulses. No carotid bruits.  Abdomen: no tenderness, no masses palpated. No hepatosplenomegaly. Bowel sounds positive.  Musculoskeletal: no clubbing / cyanosis. No joint deformity upper and lower  extremities. Good ROM, no contractures. Normal muscle tone.  Skin: no rashes, lesions, ulcers. No induration Neurologic: CN 2-12 grossly intact. Sensation intact, DTR normal. Strength 5/5 in all 4.  Psychiatric: Normal judgment and insight. Alert and oriented x 3. Normal mood.     Labs on Admission: I have personally reviewed following labs and imaging studies  CBC: Recent Labs  Lab 11/10/20 1318  WBC 6.1  NEUTROABS 3.1  HGB 13.7  HCT 41.1  MCV 85.6  PLT 291   Basic Metabolic Panel: Recent Labs  Lab 11/10/20 1318 11/10/20 1641  NA 136  --   K 4.2  --   CL 96*  --   CO2 21*  --   GLUCOSE 174*  --   BUN 18  --   CREATININE 1.55*  --   CALCIUM 11.4*  --   MG  --  1.1*  PHOS  --  4.3   GFR: CrCl cannot be calculated (Unknown ideal weight.). Liver Function Tests: Recent Labs  Lab 11/10/20 1318  AST 46*  ALT 20  ALKPHOS 53  BILITOT 1.8*  PROT 7.5  ALBUMIN 4.4   Recent Labs  Lab 11/10/20 1318  LIPASE 52*   No results for input(s): AMMONIA in the last 168 hours. Coagulation Profile: No results for input(s): INR, PROTIME in the last 168 hours. Cardiac Enzymes: No results for input(s): CKTOTAL, CKMB, CKMBINDEX, TROPONINI in the last 168 hours. BNP (last 3 results) No results for input(s): PROBNP in the last 8760 hours. HbA1C: No results for input(s): HGBA1C in the last 72 hours. CBG: No results for input(s): GLUCAP in the last 168 hours. Lipid Profile: No results for input(s): CHOL, HDL, LDLCALC, TRIG, CHOLHDL, LDLDIRECT in the last 72 hours. Thyroid Function Tests: Recent Labs    11/10/20 1641  TSH 2.618   Anemia Panel: No results for input(s): VITAMINB12, FOLATE, FERRITIN, TIBC, IRON, RETICCTPCT in the last 72 hours. Urine analysis:    Component Value Date/Time   COLORURINE YELLOW 11/10/2020 1319   APPEARANCEUR HAZY (A) 11/10/2020 1319   LABSPEC 1.041 (H) 11/10/2020 1319   PHURINE 5.0 11/10/2020 1319   GLUCOSEU NEGATIVE 11/10/2020 1319    HGBUR SMALL (A) 11/10/2020 1319   BILIRUBINUR NEGATIVE 11/10/2020 1319   KETONESUR 80 (A) 11/10/2020 1319   PROTEINUR NEGATIVE 11/10/2020 1319   UROBILINOGEN 0.2 06/18/2012 1313   NITRITE NEGATIVE 11/10/2020 1319   LEUKOCYTESUR SMALL (A) 11/10/2020 1319   Sepsis Labs: @LABRCNTIP (procalcitonin:4,lacticidven:4) ) Recent Results (from the past 240 hour(s))  Resp Panel by RT-PCR (Flu A&B, Covid) Nasopharyngeal Swab     Status: None   Collection Time: 11/10/20  1:20 PM   Specimen: Nasopharyngeal Swab; Nasopharyngeal(NP) swabs in vial transport medium  Result Value Ref Range Status   SARS Coronavirus 2 by RT PCR NEGATIVE NEGATIVE Final    Comment: (NOTE) SARS-CoV-2 target nucleic acids are NOT DETECTED.  The SARS-CoV-2 RNA is generally detectable in upper respiratory specimens during the acute phase of infection. The lowest concentration of SARS-CoV-2 viral copies this assay can detect is 138 copies/mL. A negative result does not preclude SARS-Cov-2 infection and should not be used as the sole basis for treatment or other patient management decisions. A negative result may occur with  improper specimen collection/handling, submission of specimen other than nasopharyngeal swab, presence of viral mutation(s) within the areas targeted by this assay, and inadequate number of viral copies(<138 copies/mL). A negative result must be combined with clinical observations, patient history, and epidemiological information. The expected result is Negative.  Fact Sheet for Patients:  11/12/20  Fact Sheet for Healthcare Providers:  BloggerCourse.com  This test is no t yet approved or cleared by the [place] FDA and  has been authorized for detection and/or diagnosis of SARS-CoV-2 by FDA under an Emergency Use Authorization (EUA). This EUA will remain  in effect (meaning this test can be used) for the duration of the COVID-19  declaration under Section 564(b)(1) of the Act, 21 U.S.C.section 360bbb-3(b)(1), unless the authorization is terminated  or revoked sooner.       Influenza A by PCR NEGATIVE NEGATIVE Final   Influenza B by PCR NEGATIVE NEGATIVE Final    Comment: (NOTE) The Xpert Xpress SARS-CoV-2/FLU/RSV plus assay is intended as an aid in the diagnosis of influenza from Nasopharyngeal swab specimens and should not be used as a sole basis for treatment. Nasal washings  and aspirates are unacceptable for Xpert Xpress SARS-CoV-2/FLU/RSV testing.  Fact Sheet for Patients: BloggerCourse.com  Fact Sheet for Healthcare Providers: SeriousBroker.it  This test is not yet approved or cleared by the [place] FDA and has been authorized for detection and/or diagnosis of SARS-CoV-2 by FDA under an Emergency Use Authorization (EUA). This EUA will remain in effect (meaning this test can be used) for the duration of the COVID-19 declaration under Section 564(b)(1) of the Act, 21 U.S.C. section 360bbb-3(b)(1), unless the authorization is terminated or revoked.  Performed at [facility], [address] [place], [place] [address]      Radiological Exams on Admission: DG Chest Port 1 View  Result Date: 11/10/2020 CLINICAL DATA:  Fever and vomiting. EXAM: PORTABLE CHEST 1 VIEW COMPARISON:  06/13/2019 and prior radiographs FINDINGS: The cardiomediastinal silhouette is unremarkable. There is no evidence of focal airspace disease, pulmonary edema, suspicious pulmonary nodule/mass, pleural effusion, or pneumothorax. No acute bony abnormalities are identified. IMPRESSION: No active disease. Electronically Signed   By: Harmon Pier M.D.   On: 11/10/2020 14:17   CT Angio Chest/Abd/Pel for Dissection W and/or W/WO  Result Date: 11/10/2020 CLINICAL DATA:  Mid back pain, mid abdominal pain. EXAM: CT ANGIOGRAPHY CHEST, ABDOMEN AND PELVIS TECHNIQUE:  Non-contrast CT of the chest was initially obtained. Multidetector CT imaging through the chest, abdomen and pelvis was performed using the standard protocol during bolus administration of intravenous contrast. Multiplanar reconstructed images and MIPs were obtained and reviewed to evaluate the vascular anatomy. CONTRAST:  75mL OMNIPAQUE IOHEXOL 350 MG/ML SOLN COMPARISON:  08/31/2010 FINDINGS: CTA CHEST FINDINGS Cardiovascular: Heart is normal size. Moderate descending aortic calcifications. No aneurysm or dissection. Mediastinum/Nodes: No mediastinal, hilar, or axillary adenopathy. Trachea and esophagus are unremarkable. Bilateral thyroid nodules including 3 cm nodule in the left thyroid lobe. Lungs/Pleura: Small nodule in the left apex measures 4 mm, new since 2012. Right lower lobe nodule on image 72 measures 9 mm. Right lower lobe nodule on image 64 measures 4 mm. 2 mm nodule on image 78 in the right lower lobe. 2 mm nodule posteriorly in the left lower lobe on image 62. 5 mm nodule in the right lower lobe on image 89. 2 mm nodule in the right lower lobe on image 90. No effusions. Musculoskeletal: Chest wall soft tissues are unremarkable. No acute bony abnormality. Review of the MIP images confirms the above findings. CTA ABDOMEN AND PELVIS FINDINGS VASCULAR Aorta: Aortic atherosclerosis.  No aneurysm or dissection. Celiac: Patent without evidence of aneurysm, dissection, vasculitis or significant stenosis. SMA: Patent without evidence of aneurysm, dissection, vasculitis or significant stenosis. Renals: Both renal arteries are patent without evidence of aneurysm, dissection, vasculitis, fibromuscular dysplasia or significant stenosis. IMA: Patent without evidence of aneurysm, dissection, vasculitis or significant stenosis. Inflow: Patent without evidence of aneurysm, dissection, vasculitis or significant stenosis. Veins: No obvious venous abnormality within the limitations of this arterial phase study. Review of  the MIP images confirms the above findings. NON-VASCULAR Hepatobiliary: 1 diffuse low-density throughout the liver compatible with fatty infiltration. No focal abnormality. Gallbladder unremarkable. Pancreas: No focal abnormality or ductal dilatation. Spleen: No focal abnormality.  Normal size. Adrenals/Urinary Tract: No adrenal abnormality. No focal renal abnormality. No stones or hydronephrosis. Urinary bladder is unremarkable. Stomach/Bowel: Stomach, large and small bowel grossly unremarkable. Lymphatic: No adenopathy Reproductive: Probable small fibroids within the uterus. No adnexal mass. Other: No free fluid or free air. Musculoskeletal: No acute bony abnormality. Review of the MIP images confirms the above  findings. IMPRESSION: No evidence of aortic aneurysm or dissection. Bilateral thyroid nodules, the largest 3 cm in the left thyroid lobe. Recommend thyroid US (ref: J Am Coll Radiol. 2015 Feb;12(2): 143-50). Numerous bilateral pulmonary nodules, measuring up to 9 mm in the right lower lobe. These are new since 2012. Cannot exclude metastatic disease. Non-contrast chest CT at 3-6 months is recommended. If the nodules are stable at time of repeat CT, then future CT at 18-24 months (from today's scan) is considered optional for low-risk patients, but is recommended for high-risk patients. This recommendation follows the consensus statement: Guidelines for Management of Incidental Pulmonary Nodules Detected on CT Images: From the Fleischner Society 2017; Radiology 2017; 284:228-243. Hepatic steatosis. Fibroid uterus. Electronically Signed   By: Charlett Nose M.D.   On: 11/10/2020 15:07    EKG: Independently reviewed.  It shows sinus tachycardia with a rate of 125, right bundle branch block and left anterior fascicular block.  No ST changes  Assessment/Plan Principal Problem:   Nausea & vomiting Active Problems:   Uncontrolled type 2 diabetes mellitus with hyperglycemia (HCC)   Hypomagnesemia   Back  pain   Psoriatic arthritis (HCC)   Sinus tachycardia   Dehydration   Hypercalcemia     #1 intractable nausea vomiting: Differential includes multiple.  Patient recently finished steroids.  I doubt this is related since she took time to be tapered off.  Has history of psoriasis but pain is mainly in the back could be related to some gastroparesis.  Could also be medication related.  Suspicion for some type of malignancy as patient has significant hypercalcemia.  We will admit the patient for symptomatic management.  Work-up for underlying causes.  #2 psoriatic arthritis: Appears to be in remission.  Continue to monitor.  #3 intractable back pain: Setting of hypercalcemia, patient will need some type of skeletal survey or MRI of the lumbar spine.  She reported previous compression fractures.  Pain control with PT and OT  #4 persistent sinus tachycardia: Suspected due to dehydration.  Will hydrate and monitor.  Some autonomic causes cannot be ruled out.  #5 hypomagnesemia: We will replete magnesium and monitor  #6 hypercalcemia: Check ionized calcium.  Suspicion for some kind of malignancy.  Check intact PTH especially with history of Cushing syndrome.  #7 uncontrolled type II diabetes: Sliding scale insulin.     DVT prophylaxis: Lovenox Code Status: Full code Family Communication: No family at bedside Disposition Plan: Home Consults called: None Admission status: Inpatient  Severity of Illness: The appropriate patient status for this patient is INPATIENT. Inpatient status is judged to be reasonable and necessary in order to provide the required intensity of service to ensure the patient's safety. The patient's presenting symptoms, physical exam findings, and initial radiographic and laboratory data in the context of their chronic comorbidities is felt to place them at high risk for further clinical deterioration. Furthermore, it is not anticipated that the patient will be medically  stable for discharge from the hospital within 2 midnights of admission. The following factors support the patient status of inpatient.   " The patient's presenting symptoms include intractable nausea vomiting weakness. " The worrisome physical exam findings include moon facies. " The initial radiographic and laboratory data are worrisome because of calcium 11.4. " The chronic co-morbidities include psoriatic arthritis.   * I certify that at the point of admission it is my clinical judgment that the patient will require inpatient hospital care spanning beyond 2 midnights from the point of  admission due to high intensity of service, high risk for further deterioration and high frequency of surveillance required.Lonia Blood MD [facility] Pager [phone]  If 7PM-7AM, please contact night-coverage [email or web address] Password [email or web address]  11/10/2020, 8:20 PM

## 2020-11-10 NOTE — ED Provider Notes (Signed)
[facility]-EMERGENCY DEPT Provider Note   CSN: [id number] Arrival date & time: 11/10/20  1227     History Chief Complaint  Patient presents with  . Generalized Body Aches    Melody Carpenter is a 55 y.o. female.  HPI Patient ports she has been getting sick progressively over 3 weeks.  4 weeks ago she finished her taper off long-term prednisone use for psoriatic arthritis.  She started getting achy about as soon as the steroids finished.  She then started getting a lot of nausea and pain in her back and epigastrium.  She reports she was developing severe pain throughout her spine and was supposed to get scheduled for an epidural.  She reports that she was started on tramadol and it was not helping.  She was then instructed to increase to 2 tramadol.  She reports that she started feeling worse and then was getting dry heaves and nausea.  Reports she has not been able to eat very well now for a couple of weeks.  She reports now she feels extremely weak.  She has had low-grade fever at home up to 99+ but no high fevers.  Minimal amount of cough.  Aching underneath the ribs bilaterally.  No swelling of the legs.  No new rash.    Past Medical History:  Diagnosis Date  . Arthritis   . Arthritis with psoriasis (HCC)   . Diabetes mellitus without complication [facility])     Patient Active Problem List   Diagnosis Date Noted  . Nausea & vomiting 11/10/2020  . Uncontrolled type 2 diabetes mellitus with hyperglycemia (HCC) 06/10/2018  . Hyperglycemic hyperosmolar nonketotic coma (HCC) 06/18/2012  . SOB (shortness of breath) 06/18/2012  . Hyponatremia 06/18/2012  . Lactic acid acidosis 06/18/2012    No past surgical history on file.   OB History   No obstetric history on file.     Family History  Problem Relation Age of Onset  . Hypertension Mother   . Diabetes Mellitus II Father     Social History   Tobacco Use  . Smoking status: Never Smoker  . Smokeless  tobacco: Never Used  Substance Use Topics  . Alcohol use: No  . Drug use: No    Home Medications Prior to Admission medications   Medication Sig Start Date End Date Taking? Authorizing Provider  albuterol (PROVENTIL HFA;VENTOLIN HFA) 108 (90 BASE) MCG/ACT inhaler Inhale 2 puffs into the lungs every 4 (four) hours as needed for wheezing or shortness of breath.    Yes [provider]  atorvastatin (LIPITOR) 10 MG tablet Take 10 mg by mouth at bedtime.   Yes [provider]  folic acid (FOLVITE) 1 MG tablet Take 1 mg by mouth daily.   Yes [provider]  glipiZIDE (GLUCOTROL XL) 10 MG 24 hr tablet Take 10 mg by mouth 2 (two) times daily.   Yes [provider]  golimumab (SIMPONI ARIA) 50 MG/4ML SOLN injection Inject 2,500 mg into the vein every 8 (eight) weeks. Patient getting 200 ml every 8 weeks.   Yes [provider]  hydrochlorothiazide (HYDRODIURIL) 25 MG tablet Take 1 tablet (25 mg total) by mouth daily. 06/22/12  Yes Marinda Elk, MD  insulin detemir (LEVEMIR) 100 UNIT/ML injection Inject 10 Units into the skin every evening.    Yes [provider]  latanoprost (XALATAN) 0.005 % ophthalmic solution Place 1 drop into both eyes at bedtime.    Yes [provider]  lisinopril (PRINIVIL,ZESTRIL)  5 MG tablet Take 5 mg by mouth daily.   Yes [provider]  metFORMIN (GLUCOPHAGE-XR) 500 MG 24 hr tablet Take 1,000 mg by mouth 2 (two) times daily with a meal.    Yes [provider]  methotrexate 25 MG/ML injection Inject 25 mg into the vein once a week. Pt uses on Friday.   Yes [provider]  metoprolol succinate (TOPROL-XL) 100 MG 24 hr tablet Take 100 mg by mouth 2 (two) times daily.   Yes [provider]  PARoxetine (PAXIL) 10 MG tablet Take 10 mg by mouth daily.   Yes [provider]  pioglitazone (ACTOS) 15 MG tablet Take 15 mg by mouth daily.   Yes [provider]   benzonatate (TESSALON) 100 MG capsule Take 1 capsule (100 mg total) by mouth 3 (three) times daily as needed for cough. Patient not taking: No sig reported 06/13/19   Loren Racer, MD  ondansetron (ZOFRAN ODT) 4 MG disintegrating tablet 4mg  ODT q4 hours prn nausea/vomit Patient not taking: No sig reported 06/13/19   06/15/19, MD    Allergies    Patient has no known allergies.  Review of Systems   Review of Systems 10 systems reviewed and negative except as per HPI Physical Exam Updated Vital Signs BP (!) 151/96   Pulse (!) 136   Temp 100.2 F (37.9 C) (Oral)   Resp 17   SpO2 99%   Physical Exam Constitutional:      Comments: Alert with clear mental status.  Patient is uncomfortable in appearance.  No respiratory distress.  Cushingoid appearance  HENT:     Head: Normocephalic and atraumatic.     Mouth/Throat:     Mouth: Mucous membranes are moist.     Pharynx: Oropharynx is clear.     Comments: Dentition in good condition Eyes:     Extraocular Movements: Extraocular movements intact.     Pupils: Pupils are equal, round, and reactive to light.  Cardiovascular:     Comments: Tachycardia.  No rub murmur gallop. Pulmonary:     Effort: Pulmonary effort is normal.     Breath sounds: Normal breath sounds.  Abdominal:     Comments: Abdomen is diffusely tender but most tender in the periumbilical epigastrium and right upper quadrant.  No rash or lymphadenopathy in the groin.  Musculoskeletal:        General: No swelling. Normal range of motion.     Cervical back: Neck supple.     Right lower leg: No edema.     Left lower leg: No edema.     Comments: No peripheral edema.  Calves soft and pliable.  Condition of the feet and lower legs is good.  There are no cellulitic appearing.  There is a little bit of scaling limited rash.  Neurological:     General: No focal deficit present.     Mental Status: She is oriented to person, place, and time.     Comments: Generalized  weakness but no focal neurologic deficit.  Patient uses both upper and lower extremities symmetrically and without deficit.  Cognitive function and speech normal     ED Results / Procedures / Treatments   Labs (all labs ordered are listed, but only abnormal results are displayed) Labs Reviewed  COMPREHENSIVE METABOLIC PANEL - Abnormal; Notable for the following components:      Result Value   Chloride 96 (*)    CO2 21 (*)    Glucose, Bld 174 (*)  Creatinine, Ser 1.55 (*)    Calcium 11.4 (*)    AST 46 (*)    Total Bilirubin 1.8 (*)    GFR, Estimated 40 (*)    Anion gap 19 (*)    All other components within normal limits  LIPASE, BLOOD - Abnormal; Notable for the following components:   Lipase 52 (*)    All other components within normal limits  LACTIC ACID, PLASMA - Abnormal; Notable for the following components:   Lactic Acid, Venous 2.2 (*)    All other components within normal limits  BLOOD GAS, VENOUS - Abnormal; Notable for the following components:   pCO2, Ven 37.4 (*)    pO2, Ven <31.0 (*)    Acid-base deficit 2.7 (*)    All other components within normal limits  MAGNESIUM - Abnormal; Notable for the following components:   Magnesium 1.1 (*)    All other components within normal limits  RESP PANEL BY RT-PCR (FLU A&B, COVID) ARPGX2  CULTURE, BLOOD (ROUTINE X 2)  CULTURE, BLOOD (ROUTINE X 2)  LACTIC ACID, PLASMA  CBC WITH DIFFERENTIAL/PLATELET  TSH  PHOSPHORUS  URINALYSIS, ROUTINE W REFLEX MICROSCOPIC  T4, FREE  CORTISOL  TROPONIN I (HIGH SENSITIVITY)  TROPONIN I (HIGH SENSITIVITY)    EKG EKG Interpretation  Date/Time:  Saturday Nov 10 2020 13:43:43 EDT Ventricular Rate:  125 PR Interval:  133 QRS Duration: 143 QT Interval:  322 QTC Calculation: 465 R Axis:   -87 Text Interpretation: Sinus tachycardia 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 Confirmed by Arby Barrette [phone]) on 11/10/2020 2:42:17 PM   Radiology DG Chest Port 1  View  Result Date: 11/10/2020 CLINICAL DATA:  Fever and vomiting. EXAM: PORTABLE CHEST 1 VIEW COMPARISON:  06/13/2019 and prior radiographs FINDINGS: The cardiomediastinal silhouette is unremarkable. There is no evidence of focal airspace disease, pulmonary edema, suspicious pulmonary nodule/mass, pleural effusion, or pneumothorax. No acute bony abnormalities are identified. IMPRESSION: No active disease. Electronically Signed   By: Harmon Pier M.D.   On: 11/10/2020 14:17   CT Angio Chest/Abd/Pel for Dissection W and/or W/WO  Result Date: 11/10/2020 CLINICAL DATA:  Mid back pain, mid abdominal pain. EXAM: CT ANGIOGRAPHY CHEST, ABDOMEN AND PELVIS TECHNIQUE: Non-contrast CT of the chest was initially obtained. Multidetector CT imaging through the chest, abdomen and pelvis was performed using the standard protocol during bolus administration of intravenous contrast. Multiplanar reconstructed images and MIPs were obtained and reviewed to evaluate the vascular anatomy. CONTRAST:  1mL OMNIPAQUE IOHEXOL 350 MG/ML SOLN COMPARISON:  08/31/2010 FINDINGS: CTA CHEST FINDINGS Cardiovascular: Heart is normal size. Moderate descending aortic calcifications. No aneurysm or dissection. Mediastinum/Nodes: No mediastinal, hilar, or axillary adenopathy. Trachea and esophagus are unremarkable. Bilateral thyroid nodules including 3 cm nodule in the left thyroid lobe. Lungs/Pleura: Small nodule in the left apex measures 4 mm, new since 2012. Right lower lobe nodule on image 72 measures 9 mm. Right lower lobe nodule on image 64 measures 4 mm. 2 mm nodule on image 78 in the right lower lobe. 2 mm nodule posteriorly in the left lower lobe on image 62. 5 mm nodule in the right lower lobe on image 89. 2 mm nodule in the right lower lobe on image 90. No effusions. Musculoskeletal: Chest wall soft tissues are unremarkable. No acute bony abnormality. Review of the MIP images confirms the above findings. CTA ABDOMEN AND PELVIS FINDINGS  VASCULAR Aorta: Aortic atherosclerosis.  No aneurysm or dissection. Celiac: Patent without evidence of aneurysm, dissection,  vasculitis or significant stenosis. SMA: Patent without evidence of aneurysm, dissection, vasculitis or significant stenosis. Renals: Both renal arteries are patent without evidence of aneurysm, dissection, vasculitis, fibromuscular dysplasia or significant stenosis. IMA: Patent without evidence of aneurysm, dissection, vasculitis or significant stenosis. Inflow: Patent without evidence of aneurysm, dissection, vasculitis or significant stenosis. Veins: No obvious venous abnormality within the limitations of this arterial phase study. Review of the MIP images confirms the above findings. NON-VASCULAR Hepatobiliary: 1 diffuse low-density throughout the liver compatible with fatty infiltration. No focal abnormality. Gallbladder unremarkable. Pancreas: No focal abnormality or ductal dilatation. Spleen: No focal abnormality.  Normal size. Adrenals/Urinary Tract: No adrenal abnormality. No focal renal abnormality. No stones or hydronephrosis. Urinary bladder is unremarkable. Stomach/Bowel: Stomach, large and small bowel grossly unremarkable. Lymphatic: No adenopathy Reproductive: Probable small fibroids within the uterus. No adnexal mass. Other: No free fluid or free air. Musculoskeletal: No acute bony abnormality. Review of the MIP images confirms the above findings. IMPRESSION: No evidence of aortic aneurysm or dissection. Bilateral thyroid nodules, the largest 3 cm in the left thyroid lobe. Recommend thyroid US (ref: J Am Coll Radiol. 2015 Feb;12(2): 143-50). Numerous bilateral pulmonary nodules, measuring up to 9 mm in the right lower lobe. These are new since 2012. Cannot exclude metastatic disease. Non-contrast chest CT at 3-6 months is recommended. If the nodules are stable at time of repeat CT, then future CT at 18-24 months (from today's scan) is considered optional for low-risk patients,  but is recommended for high-risk patients. This recommendation follows the consensus statement: Guidelines for Management of Incidental Pulmonary Nodules Detected on CT Images: From the Fleischner Society 2017; Radiology 2017; 284:228-243. Hepatic steatosis. Fibroid uterus. Electronically Signed   By: Charlett NoseKevin  Dover M.D.   On: 11/10/2020 15:07    Procedures Procedures   Medications Ordered in ED Medications  lactated ringers infusion ( Intravenous New Bag/Given 11/10/20 1530)  lactated ringers bolus 1,000 mL (0 mLs Intravenous Stopped 11/10/20 1634)  HYDROmorphone (DILAUDID) injection 1 mg (1 mg Intravenous Given 11/10/20 1436)  ondansetron (ZOFRAN) injection 4 mg (4 mg Intravenous Given 11/10/20 1437)  pantoprazole (PROTONIX) injection 40 mg (40 mg Intravenous Given 11/10/20 1438)  iohexol (OMNIPAQUE) 350 MG/ML injection 80 mL (75 mLs Intravenous Contrast Given 11/10/20 1446)    ED Course  I have reviewed the triage vital signs and the nursing notes.  Pertinent labs & imaging results that were available during my care of the patient were reviewed by me and considered in my medical decision making (see chart for details).    MDM Rules/Calculators/A&P                           Consult: Dr. Mikeal HawthorneGarba for admission.  Patient presents as outlined above.  At this time no focus of infection identified.  CT identifies some pulmonary nodules that will require further diagnostic evaluation but doubt these are the etiology of the patient's symptoms.  Patient has back pain throughout the thorax and lower back.  Dissection study has ruled out dissection.  Patient does have AKI and signs of dehydration with anion gap.  Rehydration initiated.  Patient remains tachycardic.  Also noted on CT were some thyroid nodules.  We will also proceed with thyroid studies.  Patient has complex medical history with psoriatic arthritis with long-term steroid treatment that was tapered and discontinued a month ago.  Since that  time, patient apparently has been getting progressively worse in terms of pain, nausea  and general weakness.  Patient does have significantly cushingoid appearance, mental status is clear, no focal motor deficits, patient does not appear encephalopathic on presentation.  Plan for admission for further diagnostic evaluation and therapeutic treatment Final Clinical Impression(s) / ED Diagnoses Final diagnoses:  Dehydration  AKI (acute kidney injury) (HCC)  Psoriatic arthritis (HCC)  Acute back pain, unspecified back location, unspecified back pain laterality    Rx / DC Orders ED Discharge Orders    None       Arby Barrette, MD 11/10/20 1839

## 2020-11-10 NOTE — ED Notes (Signed)
Critical Value 2.2   Reported to MD Pfeiffer

## 2020-11-11 ENCOUNTER — Inpatient Hospital Stay (HOSPITAL_COMMUNITY): Payer: Medicare Other

## 2020-11-11 DIAGNOSIS — G8929 Other chronic pain: Secondary | ICD-10-CM

## 2020-11-11 DIAGNOSIS — E860 Dehydration: Secondary | ICD-10-CM

## 2020-11-11 DIAGNOSIS — M545 Low back pain, unspecified: Secondary | ICD-10-CM

## 2020-11-11 DIAGNOSIS — R112 Nausea with vomiting, unspecified: Principal | ICD-10-CM

## 2020-11-11 DIAGNOSIS — K2970 Gastritis, unspecified, without bleeding: Secondary | ICD-10-CM

## 2020-11-11 DIAGNOSIS — K2900 Acute gastritis without bleeding: Secondary | ICD-10-CM | POA: Diagnosis not present

## 2020-11-11 LAB — GLUCOSE, CAPILLARY
Glucose-Capillary: 120 mg/dL — ABNORMAL HIGH (ref 70–99)
Glucose-Capillary: 160 mg/dL — ABNORMAL HIGH (ref 70–99)
Glucose-Capillary: 123 mg/dL — ABNORMAL HIGH (ref 70–99)
Glucose-Capillary: 165 mg/dL — ABNORMAL HIGH (ref 70–99)

## 2020-11-11 LAB — COMPREHENSIVE METABOLIC PANEL
ALT: 16 U/L (ref 0–44)
AST: 48 U/L — ABNORMAL HIGH (ref 15–41)
Albumin: 3.3 g/dL — ABNORMAL LOW (ref 3.5–5.0)
Alkaline Phosphatase: 42 U/L (ref 38–126)
Anion gap: 6 (ref 5–15)
BUN: 13 mg/dL (ref 6–20)
CO2: 32 mmol/L (ref 22–32)
Calcium: 10.7 mg/dL — ABNORMAL HIGH (ref 8.9–10.3)
Chloride: 98 mmol/L (ref 98–111)
Creatinine, Ser: 1.24 mg/dL — ABNORMAL HIGH (ref 0.44–1.00)
GFR, Estimated: 52 mL/min — ABNORMAL LOW (ref >60)
Glucose, Bld: 143 mg/dL — ABNORMAL HIGH (ref 70–99)
Potassium: 4.9 mmol/L (ref 3.5–5.1)
Sodium: 136 mmol/L (ref 135–145)
Total Bilirubin: 1.1 mg/dL (ref 0.3–1.2)
Total Protein: 5.8 g/dL — ABNORMAL LOW (ref 6.5–8.1)

## 2020-11-11 LAB — HEMOGLOBIN A1C
Hgb A1c MFr Bld: 7.9 % — ABNORMAL HIGH (ref 4.8–5.6)
Mean Plasma Glucose: 180.03 mg/dL

## 2020-11-11 LAB — CORTISOL: Cortisol, Plasma: 8.9 ug/dL

## 2020-11-11 LAB — CBC
HCT: 34.6 % — ABNORMAL LOW (ref 36.0–46.0)
Hemoglobin: 11.8 g/dL — ABNORMAL LOW (ref 12.0–15.0)
MCH: 29.5 pg (ref 26.0–34.0)
MCHC: 34.1 g/dL (ref 30.0–36.0)
MCV: 86.5 fL (ref 80.0–100.0)
Platelets: 270 10*3/uL (ref 150–400)
RBC: 4 MIL/uL (ref 3.87–5.11)
RDW: 13.4 % (ref 11.5–15.5)
WBC: 4.4 10*3/uL (ref 4.0–10.5)
nRBC: 0 % (ref 0.0–0.2)

## 2020-11-11 LAB — HIV ANTIBODY (ROUTINE TESTING W REFLEX): HIV Screen 4th Generation wRfx: NONREACTIVE

## 2020-11-11 LAB — T4, FREE: Free T4: 0.98 ng/dL (ref 0.61–1.12)

## 2020-11-11 MED ORDER — PANTOPRAZOLE SODIUM 40 MG IV SOLR
40.0000 mg | Freq: Two times a day (BID) | INTRAVENOUS | Status: DC
  Administered 2020-11-11 – 2020-11-13 (×5): 40 mg via INTRAVENOUS
  Filled 2020-11-11 (×5): qty 40

## 2020-11-11 MED ORDER — INSULIN DETEMIR 100 UNIT/ML ~~LOC~~ SOLN
5.0000 [IU] | Freq: Every day | SUBCUTANEOUS | Status: DC
  Administered 2020-11-11 – 2020-11-13 (×3): 5 [IU] via SUBCUTANEOUS
  Filled 2020-11-11 (×4): qty 0.05

## 2020-11-11 MED ORDER — MORPHINE SULFATE (PF) 2 MG/ML IV SOLN: 2.0000 mg | INTRAVENOUS | Status: DC | PRN

## 2020-11-11 MED ORDER — MORPHINE SULFATE (PF) 2 MG/ML IV SOLN
1.0000 mg | INTRAVENOUS | Status: DC | PRN
  Administered 2020-11-11: 1 mg via INTRAVENOUS
  Filled 2020-11-11: qty 1

## 2020-11-11 MED ORDER — OXYCODONE HCL 5 MG PO TABS: 5.0000 mg | ORAL_TABLET | ORAL | Status: DC | PRN

## 2020-11-11 MED ORDER — MORPHINE SULFATE (PF) 2 MG/ML IV SOLN
2.0000 mg | INTRAVENOUS | Status: DC | PRN
  Administered 2020-11-11 – 2020-11-12 (×2): 2 mg via INTRAVENOUS
  Filled 2020-11-11 (×3): qty 1

## 2020-11-11 MED ORDER — OXYCODONE HCL 5 MG PO TABS
5.0000 mg | ORAL_TABLET | Freq: Four times a day (QID) | ORAL | Status: DC | PRN
  Administered 2020-11-12: 5 mg via ORAL
  Filled 2020-11-11: qty 1

## 2020-11-11 MED ORDER — SUCRALFATE 1 GM/10ML PO SUSP
1.0000 g | Freq: Three times a day (TID) | ORAL | Status: DC
  Administered 2020-11-11 – 2020-11-14 (×10): 1 g via ORAL
  Filled 2020-11-11 (×10): qty 10

## 2020-11-11 NOTE — Progress Notes (Addendum)
PROGRESS NOTE    Melody Carpenter  ZES:[id number] DOB: 11-06-1965 DOA: 11/10/2020 PCP: Nonnie Done., MD    Brief Narrative:  Melody Carpenter was admitted to the hospital with the working diagnosis of intractable nausea and vomiting.   55 yo female with the past medical history of psoriasis, T2DM, Cushing's syndrome who presented with intractable nausea, vomiting and abdominal pain.  Patient has been recently on prednisone for psoriatic cysts, this medication was taper off over 4 weeks.  About 14 days after stopping steroids she developed poor appetite and nausea, symptoms progressed to vomiting and not able to hold anything down.  On her initial physical examination her temperature was 100.2 F, blood pressure 155/101, heart rate 136, respiratory 22, oxygen saturation 90% on room air.  She had dry mucous membranes, heart S1-S2 present, tachycardic, abdomen soft nontender, trace lower extremity edema.  Sodium 136, potassium 4.2, chloride 96, bicarb 21, glucose 174, BUN 18, creatinine 1.5, calcium 11.4, anion gap 19, lipase 52, AST 46, ALT 20, total bilirubin is 1.8, lactic acid 2.2, white count 6.1, hemoglobin 13.7, platelets 291. SARS COVID-19 negative.  Urinalysis specific gravity 1.041, negative protein, 6-10 red cells, 6-10 white cells, 11-20 squamous cells.  Chest radiograph no infiltrates.  CT angiography chest, abdomen pelvis with no aortic aneurysm or dissection.  Bilateral thyroid nodules at least 3 cm left thyroid.  Numerous bilateral pulmonary nodules measuring up to 9 mm in the right lower lobe.  Hepatic steatosis, fibroid uterus  EKG 125 bpm, left axis deviation, left anterior fascicular block, right bundle branch block, sinus rhythm, no significant ST segment or T wave changes.  Assessment & Plan:   Principal Problem:   Gastritis Active Problems:   Uncontrolled type 2 diabetes mellitus with hyperglycemia (HCC)   Nausea & vomiting   Hypomagnesemia   Back pain   Psoriatic  arthritis (HCC)   Dehydration   Hypercalcemia   1. Acute steroid induced gastritis. Patient with abdominal pain, nausea and vomiting, recent steroid exposure.   Plan to start patient on pantoprazole 40 mg IV q12h, change diet to soft, continue supportive IV fluids and as needed antiemetics/ analgesics.  Add sucralfate with meals.   If patient continue to have symptoms despite medical therapy will need upper endoscopy.   2. Acute on chronic back pain, spinal stenosis. MRI with spinal stenosis, no fracture.  Continue pain control with oxycodone and for severe pain will add IV morphine  Avoid non steroidal anti-inflammatory agents.  Patient scheduled for outpatient steroid spine injections.   3. T2DM uncontrolled with hyperglycemia/ Medication induced Cushing syndrome. Dyslipidemia Continue glucose control with insulin basal (reduce dose to 5 units) and sliding scale. Change diet to soft for now.   Continue with statin therapy.   Hold on oral antihyperglycemic agents for now.   4. AKI/ hypercalcemia/ hypomagnesemia/ dehydration and contraction alkalosis renal function with serum cr down to 1,24 with K at 4,9 and serum bicarbonate at 32. Ca is down to 10.7. Hold on diuretic therapy and ace inh for now.   5. Psoriatic arthritis.  No signs of active flare up, plan for outpatient follow up.   6. Depression. Continue with paroxetine.   7. HTN. Continue blood pressure control with metoprolol, hold on hctz and lisinopril due to risk of worsening AKI.   Patient continue to be at high risk for worsening gastritis.   Status is: Inpatient  Remains inpatient appropriate because:IV treatments appropriate due to intensity of illness or inability to take PO  Dispo: The patient is from: Home              Anticipated d/c is to: Home              Patient currently is not medically stable to d/c.   Difficult to place patient No   DVT prophylaxis: Enoxaparin  Code Status:   full  Family  Communication:  I spoke with patient's husband at the bedside, we talked in detail about patient's condition, plan of care and prognosis and all questions were addressed.    Subjective: Patient continue to have abdominal pain and back pain, nausea and vomiting, have improved but not yet back to baseline.   Objective: Vitals:   11/10/20 1936 11/10/20 2127 11/11/20 0140 11/11/20 0536  BP: 131/84 128/83 117/63 119/81  Pulse: (!) 126 (!) 123 (!) 102 92  Resp: 18 18 18 16   Temp: 98.2 F (36.8 C) 98.1 F (36.7 C) 98.3 F (36.8 C) 97.6 F (36.4 C)  TempSrc: Oral Oral Oral Oral  SpO2: 100% 99% 100% 99%  Weight: 84.5 kg       Intake/Output Summary (Last 24 hours) at 11/11/2020 1617 Last data filed at 11/11/2020 1010 Gross per 24 hour  Intake 2106.81 ml  Output --  Net 2106.81 ml   Filed Weights   11/10/20 1936  Weight: 84.5 kg    Examination:   General: Not in pain or dyspnea, deconditioned, moon face (Cushing).   Neurology: Awake and alert, non focal  E ENT: mild pallor, no icterus, oral mucosa moist Cardiovascular: No JVD. S1-S2 present, rhythmic, no gallops, rubs, or murmurs. No lower extremity edema. Pulmonary: positive breath sounds bilaterally, adequate air movement, no wheezing, rhonchi or rales. Gastrointestinal. Abdomen soft, mild tender at the midepigastrium.  Skin. No rashes Musculoskeletal: no joint deformities     Data Reviewed: I have personally reviewed following labs and imaging studies  CBC: Recent Labs  Lab 11/10/20 1318 11/11/20 1430  WBC 6.1 4.4  NEUTROABS 3.1  --   HGB 13.7 11.8*  HCT 41.1 34.6*  MCV 85.6 86.5  PLT 291 270   Basic Metabolic Panel: Recent Labs  Lab 11/10/20 1318 11/10/20 1641 11/11/20 1430  NA 136  --  136  K 4.2  --  4.9  CL 96*  --  98  CO2 21*  --  32  GLUCOSE 174*  --  143*  BUN 18  --  13  CREATININE 1.55*  --  1.24*  CALCIUM 11.4*  --  10.7*  MG  --  1.1*  --   PHOS  --  4.3  --    GFR: CrCl cannot be  calculated (Unknown ideal weight.). Liver Function Tests: Recent Labs  Lab 11/10/20 1318 11/11/20 1430  AST 46* 48*  ALT 20 16  ALKPHOS 53 42  BILITOT 1.8* 1.1  PROT 7.5 5.8*  ALBUMIN 4.4 3.3*   Recent Labs  Lab 11/10/20 1318  LIPASE 52*   No results for input(s): AMMONIA in the last 168 hours. Coagulation Profile: No results for input(s): INR, PROTIME in the last 168 hours. Cardiac Enzymes: No results for input(s): CKTOTAL, CKMB, CKMBINDEX, TROPONINI in the last 168 hours. BNP (last 3 results) No results for input(s): PROBNP in the last 8760 hours. HbA1C: No results for input(s): HGBA1C in the last 72 hours. CBG: Recent Labs  Lab 11/10/20 2149 11/11/20 0733 11/11/20 1157  GLUCAP 135* 120* 160*   Lipid Profile: No results for input(s): CHOL, HDL, LDLCALC,  TRIG, CHOLHDL, LDLDIRECT in the last 72 hours. Thyroid Function Tests: Recent Labs    11/10/20 1641  TSH 2.618  FREET4 0.98   Anemia Panel: No results for input(s): VITAMINB12, FOLATE, FERRITIN, TIBC, IRON, RETICCTPCT in the last 72 hours.    Radiology Studies: I have reviewed all of the imaging during this hospital visit personally     Scheduled Meds: . atorvastatin  10 mg Oral QHS  . enoxaparin (LOVENOX) injection  40 mg Subcutaneous Q24H  . feeding supplement  237 mL Oral BID BM  . folic acid  1 mg Oral Daily  . glipiZIDE  10 mg Oral BID  . hydrochlorothiazide  25 mg Oral Daily  . insulin aspart  0-15 Units Subcutaneous TID WC  . insulin aspart  0-5 Units Subcutaneous QHS  . insulin detemir  10 Units Subcutaneous QHS  . latanoprost  1 drop Both Eyes QHS  . lisinopril  5 mg Oral Daily  . metoprolol succinate  100 mg Oral BID  . PARoxetine  10 mg Oral Daily  . pioglitazone  15 mg Oral Daily   Continuous Infusions: . lactated ringers 125 mL/hr at 11/10/20 1530  . lactated ringers 125 mL/hr at 11/11/20 0305     LOS: 1 day        Melody Dicesare Annett Gula, MD

## 2020-11-12 DIAGNOSIS — K2900 Acute gastritis without bleeding: Secondary | ICD-10-CM | POA: Diagnosis not present

## 2020-11-12 DIAGNOSIS — M545 Low back pain, unspecified: Secondary | ICD-10-CM | POA: Diagnosis not present

## 2020-11-12 DIAGNOSIS — E1165 Type 2 diabetes mellitus with hyperglycemia: Secondary | ICD-10-CM

## 2020-11-12 DIAGNOSIS — E860 Dehydration: Secondary | ICD-10-CM | POA: Diagnosis not present

## 2020-11-12 LAB — BASIC METABOLIC PANEL
Anion gap: 7 (ref 5–15)
BUN: 12 mg/dL (ref 6–20)
CO2: 29 mmol/L (ref 22–32)
Calcium: 10.4 mg/dL — ABNORMAL HIGH (ref 8.9–10.3)
Chloride: 101 mmol/L (ref 98–111)
Creatinine, Ser: 1.07 mg/dL — ABNORMAL HIGH (ref 0.44–1.00)
GFR, Estimated: >60 mL/min (ref >60)
Glucose, Bld: 133 mg/dL — ABNORMAL HIGH (ref 70–99)
Potassium: 3.4 mmol/L — ABNORMAL LOW (ref 3.5–5.1)
Sodium: 137 mmol/L (ref 135–145)

## 2020-11-12 LAB — GLUCOSE, CAPILLARY
Glucose-Capillary: 146 mg/dL — ABNORMAL HIGH (ref 70–99)
Glucose-Capillary: 201 mg/dL — ABNORMAL HIGH (ref 70–99)
Glucose-Capillary: 152 mg/dL — ABNORMAL HIGH (ref 70–99)
Glucose-Capillary: 190 mg/dL — ABNORMAL HIGH (ref 70–99)

## 2020-11-12 LAB — OCCULT BLOOD X 1 CARD TO LAB, STOOL: Fecal Occult Bld: NEGATIVE

## 2020-11-12 MED ORDER — CYCLOBENZAPRINE HCL 5 MG PO TABS
5.0000 mg | ORAL_TABLET | Freq: Three times a day (TID) | ORAL | Status: DC
  Administered 2020-11-12 – 2020-11-14 (×7): 5 mg via ORAL
  Filled 2020-11-12 (×7): qty 1

## 2020-11-12 MED ORDER — POTASSIUM CHLORIDE CRYS ER 20 MEQ PO TBCR
40.0000 meq | EXTENDED_RELEASE_TABLET | Freq: Once | ORAL | Status: AC
  Administered 2020-11-12: 40 meq via ORAL
  Filled 2020-11-12: qty 2

## 2020-11-12 MED ORDER — LACTATED RINGERS IV SOLN: INTRAVENOUS | Status: DC

## 2020-11-12 MED ORDER — HYDROCODONE-ACETAMINOPHEN 5-325 MG PO TABS
0.5000 | ORAL_TABLET | ORAL | Status: DC | PRN
Start: 2020-11-12 — End: 2020-11-14
  Administered 2020-11-13 – 2020-11-14 (×4): 0.5 via ORAL
  Filled 2020-11-12 (×4): qty 1

## 2020-11-12 NOTE — Consult Note (Addendum)
Referring Provider: [facility] Primary Care Physician:  Nonnie Done., MD Primary Gastroenterologist:  Gentry Fitz  Reason for Consultation: Nausea/vomiting  HPI: Melody Carpenter is a 55 y.o. female with past medical history of psoriasis, chronic steroid use, insulin-dependent type 2 DM, psoriatic arthritis, iatrogenic Cushing's syndrome presenting for consultation of nausea/vomiting.  Patient reports persistent non-bloody nausea and vomiting over the last 3 weeks.  She states it started shortly after she was tapered off her prednisone.  She has nausea with multiple episodes of emesis daily.  She states she is unable to keep food or water down.  She has noted some intermittent dysphagia to solids.  She states she has had weight loss of approximately 30 pounds over the last month.  She has some left-sided abdominal pain, lateral and slightly above the umbilicus.  She also reports black, tarry stools for the last 1 week.  She has also been taking Aleve 2 tablets twice daily for months.  Denies aspirin or blood thinner use.  Denies family history of colon cancer or gastrointestinal malignancy.  Family history pertinent for daughter with thyroid cancer.  Patient has never had an EGD or colonoscopy.  Past Medical History:  Diagnosis Date  . Arthritis   . Arthritis with psoriasis (HCC)   . Diabetes mellitus without complication (HCC)     No past surgical history on file.  Prior to Admission medications   Medication Sig Start Date End Date Taking? Authorizing Provider  albuterol (PROVENTIL HFA;VENTOLIN HFA) 108 (90 BASE) MCG/ACT inhaler Inhale 2 puffs into the lungs every 4 (four) hours as needed for wheezing or shortness of breath.    Yes [provider]  atorvastatin (LIPITOR) 10 MG tablet Take 10 mg by mouth at bedtime.   Yes [provider]  folic acid (FOLVITE) 1 MG tablet Take 1 mg by mouth daily.   Yes [provider]  glipiZIDE (GLUCOTROL XL) 10 MG 24 hr tablet  Take 10 mg by mouth 2 (two) times daily.   Yes [provider]  golimumab (SIMPONI ARIA) 50 MG/4ML SOLN injection Inject 2,500 mg into the vein every 8 (eight) weeks. Patient getting 200 ml every 8 weeks.   Yes [provider]  hydrochlorothiazide (HYDRODIURIL) 25 MG tablet Take 1 tablet (25 mg total) by mouth daily. 06/22/12  Yes Marinda Elk, MD  insulin detemir (LEVEMIR) 100 UNIT/ML injection Inject 10 Units into the skin every evening.    Yes [provider]  latanoprost (XALATAN) 0.005 % ophthalmic solution Place 1 drop into both eyes at bedtime.    Yes [provider]  lisinopril (PRINIVIL,ZESTRIL) 5 MG tablet Take 5 mg by mouth daily.   Yes [provider]  metFORMIN (GLUCOPHAGE-XR) 500 MG 24 hr tablet Take 1,000 mg by mouth 2 (two) times daily with a meal.    Yes [provider]  methotrexate 25 MG/ML injection Inject 25 mg into the vein once a week. Pt uses on Friday.   Yes [provider]  metoprolol succinate (TOPROL-XL) 100 MG 24 hr tablet Take 100 mg by mouth 2 (two) times daily.   Yes [provider]  PARoxetine (PAXIL) 10 MG tablet Take 10 mg by mouth daily.   Yes [provider]  pioglitazone (ACTOS) 15 MG tablet Take 15 mg by mouth daily.   Yes [provider]  benzonatate (TESSALON) 100 MG capsule Take 1 capsule (100 mg total) by mouth 3 (three) times daily as needed for cough. Patient not taking: No sig  reported 06/13/19   Loren Racer, MD  ondansetron (ZOFRAN ODT) 4 MG disintegrating tablet 4mg  ODT q4 hours prn nausea/vomit Patient not taking: No sig reported 06/13/19   06/15/19, MD    Scheduled Meds: . atorvastatin  10 mg Oral QHS  . cyclobenzaprine  5 mg Oral TID  . enoxaparin (LOVENOX) injection  40 mg Subcutaneous Q24H  . feeding supplement  237 mL Oral BID BM  . folic acid  1 mg Oral Daily  . insulin aspart  0-15 Units Subcutaneous TID WC  . insulin aspart   0-5 Units Subcutaneous QHS  . insulin detemir  5 Units Subcutaneous QHS  . latanoprost  1 drop Both Eyes QHS  . metoprolol succinate  100 mg Oral BID  . pantoprazole (PROTONIX) IV  40 mg Intravenous Q12H  . PARoxetine  10 mg Oral Daily  . sucralfate  1 g Oral TID WC & HS   Continuous Infusions: . lactated ringers 75 mL/hr at 11/12/20 1429   PRN Meds:.acetaminophen, albuterol, HYDROcodone-acetaminophen, morphine injection, ondansetron **OR** ondansetron (ZOFRAN) IV  Allergies as of 11/10/2020  . (No Known Allergies)    Family History  Problem Relation Age of Onset  . Hypertension Mother   . Diabetes Mellitus II Father     Social History   Socioeconomic History  . Marital status: Married    Spouse name: Not on file  . Number of children: Not on file  . Years of education: Not on file  . Highest education level: Not on file  Occupational History  . Not on file  Tobacco Use  . Smoking status: Never Smoker  . Smokeless tobacco: Never Used  Substance and Sexual Activity  . Alcohol use: No  . Drug use: No  . Sexual activity: Yes  Other Topics Concern  . Not on file  Social History Narrative  . Not on file   Social Determinants of Health   Financial Resource Strain: Not on file  Food Insecurity: Not on file  Transportation Needs: Not on file  Physical Activity: Not on file  Stress: Not on file  Social Connections: Not on file  Intimate Partner Violence: Not on file    Review of Systems: Review of Systems  Constitutional: Positive for weight loss. Negative for chills and fever.  HENT: Negative for hearing loss and tinnitus.   Eyes: Negative for pain and redness.  Respiratory: Negative for cough and shortness of breath.   Cardiovascular: Negative for chest pain and palpitations.  Gastrointestinal: Positive for abdominal pain, melena, nausea and vomiting. Negative for blood in stool, constipation, diarrhea and heartburn.  Genitourinary: Negative for flank pain and  hematuria.  Musculoskeletal: Negative for falls and joint pain.  Skin: Negative for itching and rash.  Neurological: Negative for seizures and loss of consciousness.  Endo/Heme/Allergies: Negative for polydipsia. Does not bruise/bleed easily.  Psychiatric/Behavioral: Negative for substance abuse. The patient is not nervous/anxious.      Physical Exam: Vital signs: Vitals:   11/12/20 0957 11/12/20 1351  BP: 113/71 (!) 99/59  Pulse: 85 88  Resp:  16  Temp:  98.3 F (36.8 C)  SpO2:  97%     Physical Exam Vitals reviewed.  Constitutional:      General: She is not in acute distress. HENT:     Head: Normocephalic and atraumatic.     Nose: Nose normal. No congestion.     Mouth/Throat:     Mouth: Mucous membranes are moist.     Pharynx: Oropharynx is  clear.  Eyes:     General: No scleral icterus.    Extraocular Movements: Extraocular movements intact.     Conjunctiva/sclera: Conjunctivae normal.  Cardiovascular:     Rate and Rhythm: Normal rate and regular rhythm.     Pulses: Normal pulses.  Pulmonary:     Effort: Pulmonary effort is normal. No respiratory distress.  Abdominal:     General: Bowel sounds are normal. There is no distension.     Palpations: Abdomen is soft. There is no mass.     Tenderness: There is abdominal tenderness (mild, epigastric). There is no guarding or rebound.     Hernia: No hernia is present.  Musculoskeletal:        General: No swelling or tenderness.  Skin:    General: Skin is warm and dry.  Neurological:     General: No focal deficit present.     Mental Status: She is alert and oriented to person, place, and time.  Psychiatric:        Mood and Affect: Mood normal.        Behavior: Behavior normal. Behavior is cooperative.     GI:  Lab Results: Recent Labs    11/10/20 1318 11/11/20 1430  WBC 6.1 4.4  HGB 13.7 11.8*  HCT 41.1 34.6*  PLT 291 270   BMET Recent Labs    11/10/20 1318 11/11/20 1430 11/12/20 0631  NA 136 136 137   K 4.2 4.9 3.4*  CL 96* 98 101  CO2 21* 32 29  GLUCOSE 174* 143* 133*  BUN 18 13 12   CREATININE 1.55* 1.24* 1.07*  CALCIUM 11.4* 10.7* 10.4*   LFT Recent Labs    11/11/20 1430  PROT 5.8*  ALBUMIN 3.3*  AST 48*  ALT 16  ALKPHOS 42  BILITOT 1.1   PT/INR No results for input(s): LABPROT, INR in the last 72 hours.   Studies/Results: MR LUMBAR SPINE WO CONTRAST  Result Date: 11/11/2020 CLINICAL DATA:  56 year old female with increasing low back pain times 10 days. Pain radiating to the midthigh of both legs. No known injury. EXAM: MRI LUMBAR SPINE WITHOUT CONTRAST TECHNIQUE: Multiplanar, multisequence MR imaging of the lumbar spine was performed. No intravenous contrast was administered. COMPARISON:  CTA Chest, Abdomen, and Pelvis 11/10/2020. FINDINGS: Segmentation: Transitional anatomy. Hypoplastic ribs at T12 but only 4 lumbar type vertebral bodies. Therefore fully sacralized L5 level with vestigial L5-S1 disc is designated. Correlation with radiographs is recommended prior to any operative intervention. Alignment: Straightening of lumbar lordosis stable to that seen yesterday. No spondylolisthesis. Vertebrae: No marrow edema or evidence of acute osseous abnormality. Visualized bone marrow signal is within normal limits. Intact visible sacrum and SI joints. Conus medullaris and cauda equina: Conus extends to the T12 level. No lower spinal cord or conus signal abnormality. Paraspinal and other soft tissues: Stable, negative visible abdominal and pelvic viscera, paraspinal soft tissues. Disc levels: Congenital spinal canal narrowing related to short pedicle distance. T10-T11: Negative disc but moderate posterior element hypertrophy with mild spinal stenosis evident on series 6, image 8. Up to mild spinal cord mass effect but no cord signal abnormality. T11-T12: Mild facet hypertrophy. Borderline to mild spinal stenosis overall. T12-L1: Mild posterior disc bulging and epidural lipomatosis. Mild  spinal stenosis. L1-L2: Disc desiccation with left eccentric disc bulging. Epidural lipomatosis and mild posterior element hypertrophy. Moderate spinal stenosis overall. L2-L3: Mild disc desiccation with circumferential disc bulge. Mild to moderate facet hypertrophy and epidural lipomatosis. Severe spinal stenosis. Mild to moderate bilateral  L2 foraminal stenosis related to foraminal disc and endplate spurring. L3-L4: Epidural lipomatosis abates at this level. Mild far lateral disc bulging. Mild posterior element hypertrophy. No significant stenosis. L4-L5: Congenital spinal canal narrowing abates at this level. Subtle rightward disc bulging. Moderate facet hypertrophy. Mild right L4 foraminal stenosis. L5-S1:  Sacralized, negative. IMPRESSION: 1. 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 Correlation with radiographs is recommended prior to any operative intervention. 2. No acute osseous abnormality in the lumbar spine. Normal visible bone marrow signal. 3. 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 Up to mild lower thoracic spinal cord mass effect but no cord signal abnormality. 4. Up to moderate bilateral L2 and mild right L4 degenerative neural foraminal stenosis. Electronically Signed   By: Odessa Fleming M.D.   On: 11/11/2020 11:04   CT Angio Chest/Abd/Pel for Dissection W and/or W/WO  Result Date: 11/10/2020 CLINICAL DATA:  Mid back pain, mid abdominal pain. EXAM: CT ANGIOGRAPHY CHEST, ABDOMEN AND PELVIS TECHNIQUE: Non-contrast CT of the chest was initially obtained. Multidetector CT imaging through the chest, abdomen and pelvis was performed using the standard protocol during bolus administration of intravenous contrast. Multiplanar reconstructed images and MIPs were obtained and reviewed to evaluate the vascular anatomy. CONTRAST:  75mL OMNIPAQUE IOHEXOL 350 MG/ML SOLN COMPARISON:  08/31/2010 FINDINGS: CTA  CHEST FINDINGS Cardiovascular: Heart is normal size. Moderate descending aortic calcifications. No aneurysm or dissection. Mediastinum/Nodes: No mediastinal, hilar, or axillary adenopathy. Trachea and esophagus are unremarkable. Bilateral thyroid nodules including 3 cm nodule in the left thyroid lobe. Lungs/Pleura: Small nodule in the left apex measures 4 mm, new since 2012. Right lower lobe nodule on image 72 measures 9 mm. Right lower lobe nodule on image 64 measures 4 mm. 2 mm nodule on image 78 in the right lower lobe. 2 mm nodule posteriorly in the left lower lobe on image 62. 5 mm nodule in the right lower lobe on image 89. 2 mm nodule in the right lower lobe on image 90. No effusions. Musculoskeletal: Chest wall soft tissues are unremarkable. No acute bony abnormality. Review of the MIP images confirms the above findings. CTA ABDOMEN AND PELVIS FINDINGS VASCULAR Aorta: Aortic atherosclerosis.  No aneurysm or dissection. Celiac: Patent without evidence of aneurysm, dissection, vasculitis or significant stenosis. SMA: Patent without evidence of aneurysm, dissection, vasculitis or significant stenosis. Renals: Both renal arteries are patent without evidence of aneurysm, dissection, vasculitis, fibromuscular dysplasia or significant stenosis. IMA: Patent without evidence of aneurysm, dissection, vasculitis or significant stenosis. Inflow: Patent without evidence of aneurysm, dissection, vasculitis or significant stenosis. Veins: No obvious venous abnormality within the limitations of this arterial phase study. Review of the MIP images confirms the above findings. NON-VASCULAR Hepatobiliary: 1 diffuse low-density throughout the liver compatible with fatty infiltration. No focal abnormality. Gallbladder unremarkable. Pancreas: No focal abnormality or ductal dilatation. Spleen: No focal abnormality.  Normal size. Adrenals/Urinary Tract: No adrenal abnormality. No focal renal abnormality. No stones or hydronephrosis.  Urinary bladder is unremarkable. Stomach/Bowel: Stomach, large and small bowel grossly unremarkable. Lymphatic: No adenopathy Reproductive: Probable small fibroids within the uterus. No adnexal mass. Other: No free fluid or free air. Musculoskeletal: No acute bony abnormality. Review of the MIP images confirms the above findings. IMPRESSION: No evidence of aortic aneurysm or dissection. Bilateral thyroid nodules, the largest 3 cm in the left thyroid lobe. Recommend thyroid US (ref: J Am Coll Radiol. 2015 Feb;12(2): 143-50). Numerous bilateral pulmonary nodules,  measuring up to 9 mm in the right lower lobe. These are new since 2012. Cannot exclude metastatic disease. Non-contrast chest CT at 3-6 months is recommended. If the nodules are stable at time of repeat CT, then future CT at 18-24 months (from today's scan) is considered optional for low-risk patients, but is recommended for high-risk patients. This recommendation follows the consensus statement: Guidelines for Management of Incidental Pulmonary Nodules Detected on CT Images: From the Fleischner Society 2017; Radiology 2017; 284:228-243. Hepatic steatosis. Fibroid uterus. Electronically Signed   By: Charlett NoseKevin  Dover M.D.   On: 11/10/2020 15:07    Impression: Intractable nausea/vomiting of unknown etiology  Melena in the setting of Aleve and recent prednisone use.  PUD vs. Esophagitis from intractable nausea/vomiting. -Hgb 11.9, decreased from 13.7 on admission, which is likely dilutional -Normal BUN (13) with Cr 1.24  Hepatic steatosis on CT -T. Bili 1.1/ AST 48/ ALT 16/ ALP 42 as of 5/22  Pulmonary nodules on CT  Plan: Proceed with EGD tomorrow for further evaluation. I thoroughly discussed the procedure with the patient to include nature, alternatives, benefits, and risks (including but not limited to bleeding, infection, perforation, anesthesia/cardiac and pulmonary complications). Patient verbalized understanding and gave verbal consent to  proceed with EGD.  Protonix 40 mg IV BID.  Soft diet OK from GI standpoint, if tolerated. Otherwise, clear liquid diet.  NPO at midnight.  Recommend outpatient colonoscopy for screening once acute problems have resolved.  [facility] will follow.   LOS: 2 days   Edrick KinsAlison Baron-Johnson  PA-C 11/12/2020, 2:48 PM  Contact #  [phone]

## 2020-11-12 NOTE — Progress Notes (Addendum)
PROGRESS NOTE    Melody Carpenter  IFO:[id number] DOB: Feb 23, 1966 DOA: 11/10/2020 PCP: Nonnie Done., MD    Brief Narrative:  Melody Carpenter was admitted to the hospital with the working diagnosis of intractable nausea and vomiting suspected steroid induced gastritis.  55 yo female with the past medical history of psoriasis, T2DM, Cushing's syndrome who presented with intractable nausea, vomiting and abdominal pain.  Patient has been recently on prednisone for psoriatic arthritis, this medication was taper off over 4 weeks.  About 14 days after stopping steroids she developed poor appetite and nausea, symptoms progressed to vomiting and not able to hold anything down.  On her initial physical examination her temperature was 100.2 F, blood pressure 155/101, heart rate 136, respiratory 22, oxygen saturation 90% on room air.  She had dry mucous membranes, heart S1-S2 present, tachycardic, abdomen soft nontender, trace lower extremity edema.  Sodium 136, potassium 4.2, chloride 96, bicarb 21, glucose 174, BUN 18, creatinine 1.5, calcium 11.4, anion gap 19, lipase 52, AST 46, ALT 20, total bilirubin is 1.8, lactic acid 2.2, white count 6.1, hemoglobin 13.7, platelets 291. SARS COVID-19 negative.  Urinalysis specific gravity 1.041, negative protein, 6-10 red cells, 6-10 white cells, 11-20 squamous cells.  Chest radiograph no infiltrates.  CT angiography chest, abdomen pelvis with no aortic aneurysm or dissection.  Bilateral thyroid nodules at least 3 cm left thyroid.  Numerous bilateral pulmonary nodules measuring up to 9 mm in the right lower lobe.  Hepatic steatosis, fibroid uterus  EKG 125 bpm, left axis deviation, left anterior fascicular block, right bundle branch block, sinus rhythm, no significant ST segment or T wave changes.    Assessment & Plan:   Principal Problem:   Gastritis Active Problems:   Uncontrolled type 2 diabetes mellitus with hyperglycemia (HCC)   Nausea &  vomiting   Hypomagnesemia   Back pain   Psoriatic arthritis (HCC)   Dehydration   Hypercalcemia    1. Acute steroid induced gastritis. Patient continue to have abdominal pain, not fully tolerating soft diet, no nausea or vomiting.   Continue to be symptomatic despite high dose of IV pantoprazole 40 mg IV q12h and sucralfate.  Continue hydration with IV fluids.  Will call GI for possible endoscopic evaluation, she has a high pre-test probability for peptic ulcer disease.   2. Acute on chronic back pain, spinal stenosis. MRI with spinal stenosis, no fracture.  Plan for outpatient local steroid injection.   Change oxycodone to hydrocodone lower dose, continue with as needed IV morphine and will add flexeril tid for muscle spasms.    3. T2DM uncontrolled with hyperglycemia/ Medication induced Cushing syndrome. Dyslipidemia Fasting glucose this am is 133, capillary is 146 and 201. Continue with insulin sliding scale for glucose cover and monitoring. Basal insulin with 5 units of detemir.   On atorvastatin.   4. AKI/ hypercalcemia/ hypomagnesemia/ dehydration and contraction alkalosis. Stable renal function with serum cr at 1,0 with K at 3,4 and serum bicarbonate at 29. Continue K correction with Kcl 40 meq and resume IV fluids.   5. Psoriatic arthritis.  No clinical signs of active flare up.  6. Depression. On paroxetine.   7. HTN. holding on hctz and lisinopril due to risk of worsening AKI. Continue with metoprolol.    Patient continue to be at high risk for worsening gastritis.   Status is: Inpatient  Remains inpatient appropriate because:IV treatments appropriate due to intensity of illness or inability to take PO   Dispo: The patient  is from: Home              Anticipated d/c is to: Home              Patient currently is not medically stable to d/c.   Difficult to place patient No   DVT prophylaxis:  enoxaparin   Code Status:   full  Family Communication:   I spoke with patient's husband at the bedside, we talked in detail about patient's condition, plan of care and prognosis and all questions were addressed.     Consultants:   GI    Subjective: Patient continue to have abdominal pain and poor oral intake, no nausea or vomiting, no chest pain.  Continue to have back pain worse with movement.   Objective: Vitals:   11/11/20 1838 11/11/20 2158 11/12/20 0544 11/12/20 0957  BP: 118/80 105/63 (!) 107/53 113/71  Pulse: 78 82 77 85  Resp: 17 16 14    Temp: 98.1 F (36.7 C) 98.3 F (36.8 C) 98.1 F (36.7 C)   TempSrc: Oral Oral Oral   SpO2: 99% 100% 97%   Weight:        Intake/Output Summary (Last 24 hours) at 11/12/2020 1346 Last data filed at 11/12/2020 11/14/2020 Gross per 24 hour  Intake 840 ml  Output --  Net 840 ml   Filed Weights   11/10/20 1936  Weight: 84.5 kg    Examination:   General: Not in pain or dyspnea, deconditioned  Neurology: Awake and alert, non focal  E ENT: mild pallor, no icterus, oral mucosa moist, Moon face (Cushing) Cardiovascular: No JVD. S1-S2 present, rhythmic, no gallops, rubs, or murmurs. No lower extremity edema. Pulmonary: positive breath sounds bilaterally, adequate air movement, no wheezing, rhonchi or rales. Gastrointestinal. Abdomen soft and non tender Skin. No rashes Musculoskeletal: no joint deformities     Data Reviewed: I have personally reviewed following labs and imaging studies  CBC: Recent Labs  Lab 11/10/20 1318 11/11/20 1430  WBC 6.1 4.4  NEUTROABS 3.1  --   HGB 13.7 11.8*  HCT 41.1 34.6*  MCV 85.6 86.5  PLT 291 270   Basic Metabolic Panel: Recent Labs  Lab 11/10/20 1318 11/10/20 1641 11/11/20 1430 11/12/20 0631  NA 136  --  136 137  K 4.2  --  4.9 3.4*  CL 96*  --  98 101  CO2 21*  --  32 29  GLUCOSE 174*  --  143* 133*  BUN 18  --  13 12  CREATININE 1.55*  --  1.24* 1.07*  CALCIUM 11.4*  --  10.7* 10.4*  MG  --  1.1*  --   --   PHOS  --  4.3  --   --     GFR: CrCl cannot be calculated (Unknown ideal weight.). Liver Function Tests: Recent Labs  Lab 11/10/20 1318 11/11/20 1430  AST 46* 48*  ALT 20 16  ALKPHOS 53 42  BILITOT 1.8* 1.1  PROT 7.5 5.8*  ALBUMIN 4.4 3.3*   Recent Labs  Lab 11/10/20 1318  LIPASE 52*   No results for input(s): AMMONIA in the last 168 hours. Coagulation Profile: No results for input(s): INR, PROTIME in the last 168 hours. Cardiac Enzymes: No results for input(s): CKTOTAL, CKMB, CKMBINDEX, TROPONINI in the last 168 hours. BNP (last 3 results) No results for input(s): PROBNP in the last 8760 hours. HbA1C: Recent Labs    11/11/20 1155  HGBA1C 7.9*   CBG: Recent Labs  Lab 11/11/20  1157 11/11/20 1717 11/11/20 2200 11/12/20 0743 11/12/20 1114  GLUCAP 160* 123* 165* 146* 201*   Lipid Profile: No results for input(s): CHOL, HDL, LDLCALC, TRIG, CHOLHDL, LDLDIRECT in the last 72 hours. Thyroid Function Tests: Recent Labs    11/10/20 1641  TSH 2.618  FREET4 0.98   Anemia Panel: No results for input(s): VITAMINB12, FOLATE, FERRITIN, TIBC, IRON, RETICCTPCT in the last 72 hours.    Radiology Studies: I have reviewed all of the imaging during this hospital visit personally     Scheduled Meds: . atorvastatin  10 mg Oral QHS  . enoxaparin (LOVENOX) injection  40 mg Subcutaneous Q24H  . feeding supplement  237 mL Oral BID BM  . folic acid  1 mg Oral Daily  . insulin aspart  0-15 Units Subcutaneous TID WC  . insulin aspart  0-5 Units Subcutaneous QHS  . insulin detemir  5 Units Subcutaneous QHS  . latanoprost  1 drop Both Eyes QHS  . metoprolol succinate  100 mg Oral BID  . pantoprazole (PROTONIX) IV  40 mg Intravenous Q12H  . PARoxetine  10 mg Oral Daily  . sucralfate  1 g Oral TID WC & HS   Continuous Infusions:   LOS: 2 days        Kindsey Eblin Annett Gula, MD

## 2020-11-13 ENCOUNTER — Encounter (HOSPITAL_COMMUNITY): Payer: Self-pay | Admitting: Internal Medicine

## 2020-11-13 ENCOUNTER — Inpatient Hospital Stay (HOSPITAL_COMMUNITY): Payer: Medicare Other | Admitting: Anesthesiology

## 2020-11-13 ENCOUNTER — Encounter (HOSPITAL_COMMUNITY)
Admission: EM | Disposition: A | Discharge status: Home / Self Care | Payer: Self-pay | Source: Home / Self Care | Attending: Internal Medicine

## 2020-11-13 DIAGNOSIS — M545 Low back pain, unspecified: Secondary | ICD-10-CM | POA: Diagnosis not present

## 2020-11-13 DIAGNOSIS — K2900 Acute gastritis without bleeding: Secondary | ICD-10-CM | POA: Diagnosis not present

## 2020-11-13 DIAGNOSIS — E860 Dehydration: Secondary | ICD-10-CM | POA: Diagnosis not present

## 2020-11-13 HISTORY — PX: BIOPSY: SHX5522

## 2020-11-13 HISTORY — PX: ESOPHAGOGASTRODUODENOSCOPY: SHX5428

## 2020-11-13 LAB — BASIC METABOLIC PANEL
Anion gap: 11 (ref 5–15)
BUN: 10 mg/dL (ref 6–20)
CO2: 26 mmol/L (ref 22–32)
Calcium: 10.2 mg/dL (ref 8.9–10.3)
Chloride: 101 mmol/L (ref 98–111)
Creatinine, Ser: 1 mg/dL (ref 0.44–1.00)
GFR, Estimated: >60 mL/min (ref >60)
Glucose, Bld: 159 mg/dL — ABNORMAL HIGH (ref 70–99)
Potassium: 3.6 mmol/L (ref 3.5–5.1)
Sodium: 138 mmol/L (ref 135–145)

## 2020-11-13 LAB — PTH, INTACT AND CALCIUM
Calcium, Total (PTH): 10.5 mg/dL — ABNORMAL HIGH (ref 8.7–10.2)
PTH: 8 pg/mL — ABNORMAL LOW (ref 15–65)

## 2020-11-13 LAB — GLUCOSE, CAPILLARY
Glucose-Capillary: 164 mg/dL — ABNORMAL HIGH (ref 70–99)
Glucose-Capillary: 144 mg/dL — ABNORMAL HIGH (ref 70–99)
Glucose-Capillary: 141 mg/dL — ABNORMAL HIGH (ref 70–99)
Glucose-Capillary: 128 mg/dL — ABNORMAL HIGH (ref 70–99)
Glucose-Capillary: 143 mg/dL — ABNORMAL HIGH (ref 70–99)

## 2020-11-13 LAB — CALCIUM, IONIZED: Calcium, Ionized, Serum: 5.7 mg/dL — ABNORMAL HIGH (ref 4.5–5.6)

## 2020-11-13 SURGERY — EGD (ESOPHAGOGASTRODUODENOSCOPY)
Anesthesia: Monitor Anesthesia Care

## 2020-11-13 MED ORDER — PROPOFOL 10 MG/ML IV BOLUS
INTRAVENOUS | Status: AC
  Filled 2020-11-13: qty 20

## 2020-11-13 MED ORDER — LIP MEDEX EX OINT
TOPICAL_OINTMENT | CUTANEOUS | Status: AC
  Filled 2020-11-13: qty 7

## 2020-11-13 MED ORDER — PROPOFOL 500 MG/50ML IV EMUL
INTRAVENOUS | Status: AC
  Filled 2020-11-13: qty 50

## 2020-11-13 MED ORDER — LACTATED RINGERS IV SOLN: INTRAVENOUS | Status: DC

## 2020-11-13 MED ORDER — PROPOFOL 500 MG/50ML IV EMUL
INTRAVENOUS | Status: DC | PRN
  Administered 2020-11-13: 130 ug/kg/min via INTRAVENOUS

## 2020-11-13 MED ORDER — PROPOFOL 10 MG/ML IV BOLUS
INTRAVENOUS | Status: DC | PRN
  Administered 2020-11-13: 20 mg via INTRAVENOUS

## 2020-11-13 NOTE — Interval H&P Note (Signed)
History and Physical Interval Note:  11/13/2020 2:41 PM  Melody Carpenter  has presented today for surgery, with the diagnosis of nausea and vomiting.  The various methods of treatment have been discussed with the patient. After consideration of risks, benefits and other options for treatment, the patient has consented to  Procedure(s): ESOPHAGOGASTRODUODENOSCOPY (EGD) (N/A) as a surgical intervention.  The patient's history has been reviewed, patient examined, no change in status, stable for surgery.  I have reviewed the patient's chart and labs.  Questions were answered to the patient's satisfaction.     Katy Fitch Tarisha Fader

## 2020-11-13 NOTE — Progress Notes (Signed)
PROGRESS NOTE    Melody Carpenter  JFH:[id number] DOB: 05-12-1966 DOA: 11/10/2020 PCP: Nonnie Done., MD    Brief Narrative:  Melody Carpenter was admitted to the hospital with the working diagnosis of intractable nausea and vomiting suspected steroid induced gastritis.  55 yo female with the past medical history of psoriasis, T2DM, Cushing's syndrome who presentedwith intractable nausea, vomiting and abdominal pain. Patient has been recently on prednisone for psoriatic arthritis, this medication was taper off over 4 weeks. About 14 days after stopping steroids she developed poor appetite and nausea, symptoms progressed to vomiting and not able to hold anything down. On her initial physical examination her temperature was 100.2 F, blood pressure 155/101, heart rate 136, respiratory 22, oxygen saturation 90% on room air.She had dry mucous membranes, heart S1-S2 present, tachycardic, abdomen soft nontender, trace lower extremity edema.  Sodium 136, potassium 4.2, chloride 96, bicarb 21, glucose 174, BUN 18, creatinine 1.5, calcium 11.4, anion gap 19, lipase 52, AST 46, ALT 20, total bilirubin is 1.8, lactic acid 2.2, white count 6.1, hemoglobin 13.7, platelets 291. SARS COVID-19 negative.  Urinalysis specific gravity 1.041, negative protein, 6-10 red cells, 6-10 white cells, 11-20 squamous cells.  Chest radiograph no infiltrates.  CT angiography chest, abdomen pelvis with no aortic aneurysm or dissection. Bilateral thyroid nodules at least 3 cm left thyroid. Numerous bilateral pulmonary nodules measuring up to 9 mm in the right lower lobe. Hepatic steatosis, fibroid uterus  EKG 125 bpm, left axis deviation, left anterior fascicular block, right bundle branch block, sinus rhythm, no significant ST segment or T wave changes.  Patient was placed on supportive medical therapy with IV fluids, analgesics and antiacids.  GI was consulted for endoscopic procedure, rule out steroid  induced gastritis, possible peptic ulcer disease.   Assessment & Plan:   Principal Problem:   Gastritis Active Problems:   Uncontrolled type 2 diabetes mellitus with hyperglycemia (HCC)   Nausea & vomiting   Hypomagnesemia   Back pain   Psoriatic arthritis (HCC)   Dehydration   Hypercalcemia     1. Acute steroid induced gastritis. patient tolerating soft diet, she has been NPO after midnight in preparation for endoscopic procedure today.  Nausea and vomiting have improved, along with abdominal pain but not yet back to baseline.   Plan to continue high dose of IV pantoprazole, along with IV fluids and as needed antiemetics.  Follow with EGD results and further recommendations from Melody Carpenter from GI.   2. Acute on chronic back pain, spinal stenosis. MRI with spinal stenosis, no fracture.    Improved pain control with low dose hydrocodone and tid flexeril. Encourage out of bed and ambulate in the hallway.  Avoid non steroidal anti-inflammatory agents.  Plan for outpatient local steroid injection.   3. T2DM uncontrolled with hyperglycemia/ Medication induced Cushing syndrome. Dyslipidemia Glucose has been well controlled with fasting glucose at 159, capillary 164 and 144. On insulin sliding scale for glucose cover and monitoring along with basal insulin with 5 units of detemir.   Continue with atorvastatin.   4. AKI/ hypercalcemia/ hypomagnesemia/ dehydration and contraction alkalosis. Renal function with serum cr at 1,0, K is 3,6 and serum bicarbonate 26. Ca is 10,2.  Plan to discontinue IV fluids today after resumption of po diet, after EGD.   5. Psoriatic arthritis. No current clinical signs of active flare up.  6. Depression. Continue with paroxetine.   7. HTN. Blood pressure is 126/72 mmHg this am, continue metoprolol for blood pressure control.  Continue to hold on HCTZ for now.    Status is: Inpatient  Remains inpatient appropriate because:IV  treatments appropriate due to intensity of illness or inability to take PO   Dispo: The patient is from: Home              Anticipated d/c is to: Home              Patient currently is not medically stable to d/c. Possible dc home in the next 24 hrs.    Difficult to place patient No   DVT prophylaxis: Enoxaparin   Code Status:   full  Family Communication:  No family at the bedside      Consultants:    GI      Subjective: Patient continue to have abdominal pain but improved from yesterday, she has been NPO past midnight. Back pain also better but not yet back to baseline, no chest pain or dyspnea.   Objective: Vitals:   11/12/20 0957 11/12/20 1351 11/12/20 2121 11/13/20 0526  BP: 113/71 (!) 99/59 111/64 126/72  Pulse: 85 88 92 89  Resp:  16 20 20   Temp:  98.3 F (36.8 C) 98.8 F (37.1 C) 98.5 F (36.9 C)  TempSrc:  Oral Oral Oral  SpO2:  97% 99% 97%  Weight:        Intake/Output Summary (Last 24 hours) at 11/13/2020 1208 Last data filed at 11/13/2020 0600 Gross per 24 hour  Intake 686.11 ml  Output --  Net 686.11 ml   Filed Weights   11/10/20 1936  Weight: 84.5 kg    Examination:   General: Not in pain or dyspnea, deconditioned  Neurology: Awake and alert, non focal  E ENT: no pallor, no icterus, oral mucosa moist, Cushing's moon face.  Cardiovascular: No JVD. S1-S2 present, rhythmic, no gallops, rubs, or murmurs. No lower extremity edema. Pulmonary: positive breath sounds bilaterally, with wheezing, rhonchi or rales. Gastrointestinal. Abdomen mild distended non tender to superficial palpation Skin. No rashes Musculoskeletal: no joint deformities     Data Reviewed: I have personally reviewed following labs and imaging studies  CBC: Recent Labs  Lab 11/10/20 1318 11/11/20 1430  WBC 6.1 4.4  NEUTROABS 3.1  --   HGB 13.7 11.8*  HCT 41.1 34.6*  MCV 85.6 86.5  PLT 291 270   Basic Metabolic Panel: Recent Labs  Lab 11/10/20 1318 11/10/20 1641  11/11/20 1430 11/12/20 0631 11/13/20 0545  NA 136  --  136 137 138  K 4.2  --  4.9 3.4* 3.6  CL 96*  --  98 101 101  CO2 21*  --  32 29 26  GLUCOSE 174*  --  143* 133* 159*  BUN 18  --  13 12 10   CREATININE 1.55*  --  1.24* 1.07* 1.00  CALCIUM 11.4*  --  10.7*  10.5* 10.4* 10.2  MG  --  1.1*  --   --   --   PHOS  --  4.3  --   --   --    GFR: CrCl cannot be calculated (Unknown ideal weight.). Liver Function Tests: Recent Labs  Lab 11/10/20 1318 11/11/20 1430  AST 46* 48*  ALT 20 16  ALKPHOS 53 42  BILITOT 1.8* 1.1  PROT 7.5 5.8*  ALBUMIN 4.4 3.3*   Recent Labs  Lab 11/10/20 1318  LIPASE 52*   No results for input(s): AMMONIA in the last 168 hours. Coagulation Profile: No results for input(s): INR, PROTIME in the  last 168 hours. Cardiac Enzymes: No results for input(s): CKTOTAL, CKMB, CKMBINDEX, TROPONINI in the last 168 hours. BNP (last 3 results) No results for input(s): PROBNP in the last 8760 hours. HbA1C: Recent Labs    11/11/20 1155  HGBA1C 7.9*   CBG: Recent Labs  Lab 11/12/20 1114 11/12/20 1624 11/12/20 2118 11/13/20 0713 11/13/20 1126  GLUCAP 201* 152* 190* 164* 144*   Lipid Profile: No results for input(s): CHOL, HDL, LDLCALC, TRIG, CHOLHDL, LDLDIRECT in the last 72 hours. Thyroid Function Tests: Recent Labs    11/10/20 1641  TSH 2.618  FREET4 0.98   Anemia Panel: No results for input(s): VITAMINB12, FOLATE, FERRITIN, TIBC, IRON, RETICCTPCT in the last 72 hours.    Radiology Studies: I have reviewed all of the imaging during this hospital visit personally     Scheduled Meds: . atorvastatin  10 mg Oral QHS  . cyclobenzaprine  5 mg Oral TID  . enoxaparin (LOVENOX) injection  40 mg Subcutaneous Q24H  . feeding supplement  237 mL Oral BID BM  . folic acid  1 mg Oral Daily  . insulin aspart  0-15 Units Subcutaneous TID WC  . insulin aspart  0-5 Units Subcutaneous QHS  . insulin detemir  5 Units Subcutaneous QHS  . latanoprost   1 drop Both Eyes QHS  . metoprolol succinate  100 mg Oral BID  . pantoprazole (PROTONIX) IV  40 mg Intravenous Q12H  . PARoxetine  10 mg Oral Daily  . sucralfate  1 g Oral TID WC & HS   Continuous Infusions: . lactated ringers 75 mL/hr at 11/13/20 0330     LOS: 3 days        Dequane Strahan Annett Gula, MD

## 2020-11-13 NOTE — Transfer of Care (Signed)
Immediate Anesthesia Transfer of Care Note  Patient: Melody Carpenter  Procedure(s) Performed: ESOPHAGOGASTRODUODENOSCOPY (EGD) (N/A ) BIOPSY of duodenum and gastric antrum  Patient Location: PACU and Endoscopy Unit  Anesthesia Type:MAC  Level of Consciousness: awake, alert , oriented and patient cooperative  Airway & Oxygen Therapy: Patient Spontanous Breathing and Patient connected to face mask oxygen  Post-op Assessment: Report given to RN, Post -op Vital signs reviewed and stable and Patient moving all extremities  Post vital signs: Reviewed and stable  Last Vitals:  Vitals Value Taken Time  BP 98/33 11/13/20 1520  Temp    Pulse 84 11/13/20 1520  Resp 18 11/13/20 1520  SpO2 100 % 11/13/20 1520  Vitals shown include unvalidated device data.  Last Pain:  Vitals:   11/13/20 1315  TempSrc: Oral  PainSc: 5       Patients Stated Pain Goal: 0 (25/85/27 7824)  Complications: No complications documented.

## 2020-11-13 NOTE — Anesthesia Postprocedure Evaluation (Signed)
Anesthesia Post Note  Patient: Melody Carpenter  Procedure(s) Performed: ESOPHAGOGASTRODUODENOSCOPY (EGD) (N/A ) BIOPSY of duodenum and gastric antrum     Patient location during evaluation: PACU Anesthesia Type: MAC Level of consciousness: awake and alert Pain management: pain level controlled Vital Signs Assessment: post-procedure vital signs reviewed and stable Respiratory status: spontaneous breathing, nonlabored ventilation, respiratory function stable and patient connected to nasal cannula oxygen Cardiovascular status: stable and blood pressure returned to baseline Postop Assessment: no apparent nausea or vomiting Anesthetic complications: no   No complications documented.  Last Vitals:  Vitals:   11/13/20 1315 11/13/20 1521  BP: (!) 110/46 (!) 98/33  Pulse: 78 88  Resp: 10 (!) 22  Temp: 37.7 C 36.8 C  SpO2: 93% 97%    Last Pain:  Vitals:   11/13/20 1521  TempSrc: Oral  PainSc: 0-No pain                 Skyylar Kopf S

## 2020-11-13 NOTE — Op Note (Signed)
[facility] Patient Name: Melody Carpenter Procedure Date: 11/13/2020 MRN: [id number] Attending MD: Bernette Redbird , MD Date of Birth: [birth year] CSN: [id number] Age: 55 Admit Type: Inpatient Procedure:                Upper GI endoscopy Indications:              Nausea with vomiting Providers:                Bernette Redbird, MD, Dartha Lodge, RN, Rosilyn Mings, Technician, Lauree Chandler. [place], CRNA Referring MD:              Medicines:                Monitored Anesthesia Care Complications:            No immediate complications. Estimated Blood Loss:     Estimated blood loss was minimal. Procedure:                Pre-Anesthesia Assessment:                           - Prior to the procedure, a History and Physical                            was performed, and patient medications and                            allergies were reviewed. The patient's tolerance of                            previous anesthesia was also reviewed. The risks                            and benefits of the procedure and the sedation                            options and risks were discussed with the patient.                            All questions were answered, and informed consent                            was obtained. Prior Anticoagulants: The patient has                            taken no previous anticoagulant or antiplatelet                            agents except for NSAID medication. ASA Grade                            Assessment: III - A patient with severe systemic  disease. After reviewing the risks and benefits,                            the patient was deemed in satisfactory condition to                            undergo the procedure.                           After obtaining informed consent, the endoscope was                            passed under direct vision. Throughout the                            procedure,  the patient's blood pressure, pulse, and                            oxygen saturations were monitored continuously. The                            GIF-H190 ([id number] Olympus gastroscope was                            introduced through the mouth, and advanced to the                            second part of duodenum. The upper GI endoscopy was                            accomplished without difficulty. The patient                            tolerated the procedure well. Scope In: Scope Out: Findings:      The examined esophagus was normal.      The entire examined stomach was normal.      The cardia and gastric fundus were normal on retroflexion.      There is no endoscopic evidence of erythema or inflammatory changes       suggestive of gastritis, ulceration or erosion in the entire examined       stomach.      The examined duodenum was normal.      Biopsies were taken with a cold forceps in the gastric antrum and in the       second portion of the duodenum for histology. Impression:               - No cause for nausea/vomiting endoscopically                            evident. No NSAID gastropathy.                           - Normal esophagus.                           -  Normal stomach.                           - Normal examined duodenum.                           - Biopsies were taken with a cold forceps for                            histology in the gastric antrum and in the second                            portion of the duodenum. Moderate Sedation:      This patient was sedated with monitored anesthesia care, not moderate       sedation. Recommendation:           - Await pathology results.                           - Continue present medications. Procedure Code(s):        --- Professional ---                           [phone], Esophagogastroduodenoscopy, flexible,                            transoral; with biopsy, single or multiple Diagnosis Code(s):        --- Professional  ---                           R11.2, Nausea with vomiting, unspecified CPT copyright 2019 [employer]. All rights reserved. The codes documented in this report are preliminary and upon coder review may  be revised to meet current compliance requirements. Bernette Redbird, MD 11/13/2020 3:20:35 PM This report has been signed electronically. Number of Addenda: 0

## 2020-11-13 NOTE — Progress Notes (Signed)
Report called to [facility] RN on 6E. RN made aware of pt's IV infiltration. RN also made aware ice applied and extremity elevated. RN as no further questions or concerns. Pt is cleared to go to floor at this time.

## 2020-11-13 NOTE — Progress Notes (Signed)
Patient's endoscopy was essentially normal.  No cause for nausea or vomiting was seen.    Note that the patient's stool was Hemoccult negative at time of admission, and despite her history of dark tarry stools while on nonsteroidal anti-inflammatory drugs, there was no evidence of NSAID gastropathy.  The patient indicates that she is feeling somewhat better today.  Recommendation: Advance diet and see how she does.  Florencia Reasons, M.D. Pager [phone] If no answer or after 5 PM call [phone]

## 2020-11-13 NOTE — Anesthesia Preprocedure Evaluation (Signed)
Anesthesia Evaluation  Patient identified by MRN, date of birth, ID band Patient awake    Reviewed: Allergy & Precautions, NPO status , Patient's Chart, lab work & pertinent test results  Airway Mallampati: II  TM Distance: >3 FB Neck ROM: Full    Dental no notable dental hx.    Pulmonary neg pulmonary ROS,    Pulmonary exam normal breath sounds clear to auscultation       Cardiovascular negative cardio ROS Normal cardiovascular exam Rhythm:Regular Rate:Normal     Neuro/Psych negative neurological ROS  negative psych ROS   GI/Hepatic negative GI ROS, Neg liver ROS,   Endo/Other  diabetes, Insulin DependentCushing syndrome  Renal/GU negative Renal ROS  negative genitourinary   Musculoskeletal negative musculoskeletal ROS (+)   Abdominal   Peds negative pediatric ROS (+)  Hematology negative hematology ROS (+)   Anesthesia Other Findings   Reproductive/Obstetrics negative OB ROS                             Anesthesia Physical Anesthesia Plan  ASA: III  Anesthesia Plan: MAC   Post-op Pain Management:    Induction: Intravenous  PONV Risk Score and Plan:   Airway Management Planned: Simple Face Mask  Additional Equipment:   Intra-op Plan:   Post-operative Plan:   Informed Consent: I have reviewed the patients History and Physical, chart, labs and discussed the procedure including the risks, benefits and alternatives for the proposed anesthesia with the patient or authorized representative who has indicated his/her understanding and acceptance.     Dental advisory given  Plan Discussed with: CRNA and Surgeon  Anesthesia Plan Comments:         Anesthesia Quick Evaluation

## 2020-11-13 NOTE — Progress Notes (Signed)
Initial Nutrition Assessment  DOCUMENTATION CODES:   Obesity unspecified  INTERVENTION:   Once diet advanced: -resume Ensure Enlive po BID, each supplement provides 350 kcal and 20 grams of protein  NUTRITION DIAGNOSIS:   Inadequate oral intake related to acute illness,nausea,vomiting (gastritis) as evidenced by per patient/family report.  GOAL:   Patient will meet greater than or equal to 90% of their needs  MONITOR:   PO intake,Supplement acceptance,Labs,Weight trends,I & O's  REASON FOR ASSESSMENT:   Malnutrition Screening Tool    ASSESSMENT:   55 y.o. female with past medical history of psoriasis, chronic steroid use, insulin-dependent type 2 DM, psoriatic arthritis, iatrogenic Cushing's syndrome presenting for consultation of nausea/vomiting.  Patient currently NPO awaiting EGD today to assess gastritis. Pt has been having N/V over the past few weeks as she has been weaning off prednisone. Has not been able to eat much given symptoms and pain. Has also reported some dysphagia at times. Ensure has already ordered, will continue for when diet is advanced following procedure.  Per weight records in care everywhere, pt weighed 203 lbs on 3/9. Pt reports ~30 lbs of weight loss. Per records, pt has lost 17 lbs (8% wt loss x 2.5 months, significant for time frame).   Medications: Folic acid, Carafate, Lactated ringers  Labs reviewed: CBGs: 144-190  NUTRITION - FOCUSED PHYSICAL EXAM:  Deferred.  Diet Order:   Diet Order            Diet NPO time specified  Diet effective midnight                 EDUCATION NEEDS:   No education needs have been identified at this time  Skin:  Skin Assessment: Reviewed RN Assessment  Last BM:  PTA  Height:   Ht Readings from Last 1 Encounters:  06/18/12 5\' 2"  (1.575 m)    Weight:   Wt Readings from Last 1 Encounters:  11/10/20 84.5 kg    BMI:  Body mass index is 34.07 kg/m.  Estimated Nutritional Needs:    Kcal:  1500-1700  Protein:  60-75g  Fluid:  1.7L/day  11/12/20, MS, RD, LDN Inpatient Clinical Dietitian Contact information available via Amion

## 2020-11-13 NOTE — Progress Notes (Signed)
Upon pt arrival to recovery, Anesthesia states pt's IV noted to be infiltrated during procedure. IV d/c'd and resited at that time by Nurse Anesthetist. This RN noted pt's right forearm to be swollen. +circulation, sensation and movement. No discoloration noted. Ice applied, and arm elevated for comfort. Pt has no complaints at this time. Charge RN aware.

## 2020-11-14 ENCOUNTER — Encounter (HOSPITAL_COMMUNITY): Payer: Self-pay | Admitting: Gastroenterology

## 2020-11-14 DIAGNOSIS — M549 Dorsalgia, unspecified: Secondary | ICD-10-CM

## 2020-11-14 DIAGNOSIS — K2900 Acute gastritis without bleeding: Secondary | ICD-10-CM | POA: Diagnosis not present

## 2020-11-14 DIAGNOSIS — N179 Acute kidney failure, unspecified: Secondary | ICD-10-CM

## 2020-11-14 LAB — GLUCOSE, CAPILLARY
Glucose-Capillary: 156 mg/dL — ABNORMAL HIGH (ref 70–99)
Glucose-Capillary: 196 mg/dL — ABNORMAL HIGH (ref 70–99)

## 2020-11-14 LAB — SURGICAL PATHOLOGY

## 2020-11-14 MED ORDER — PANTOPRAZOLE SODIUM 40 MG PO TBEC
40.0000 mg | DELAYED_RELEASE_TABLET | Freq: Every day | ORAL | Status: DC
  Administered 2020-11-14: 40 mg via ORAL
  Filled 2020-11-14: qty 1

## 2020-11-14 MED ORDER — HYDROCODONE-ACETAMINOPHEN 5-325 MG PO TABS: 1.0000 | ORAL_TABLET | Freq: Four times a day (QID) | ORAL | 0 refills | Status: DC | PRN

## 2020-11-14 NOTE — Progress Notes (Signed)
Tolerating soft diet.  No nausea or vomiting.  Biopsies from yesterday's endoscopy are pending.  I will contact the patient when they are available.  Okay for discharge from the GI tract standpoint whenever her general medical work-up is complete.  I will sign off at this time.  Please call me if you have questions or if I can be of further assistance in this patient's care.    --I do not think follow-up in the GI office will be needed unless the patient has ongoing GI symptoms.  --The patient was not on PPI therapy prior to admission, but I would probably continue the patient on once daily PPI dosing for prophylaxis if she is going to continue to use ulcerogenic medication (nonsteroidal anti-inflammatory drugs) following discharge.  Florencia Reasons, M.D. Pager [phone] If no answer or after 5 PM call [phone]

## 2020-11-14 NOTE — Discharge Summary (Signed)
Physician Discharge Summary  Melody Carpenter GQQ:[id number] DOB: 01/24/1966 DOA: 11/10/2020  PCP: Nonnie Done., MD  Admit date: 11/10/2020 Discharge date: 11/14/2020  Time spent: 35 minutes  Recommendations for Outpatient Follow-up:  1. PCP in 1 week, if she has recurrence of nausea and vomiting will need evaluation for adrenal insufficiency   Discharge Diagnoses:  Nausea and vomiting   Uncontrolled type 2 diabetes mellitus with hyperglycemia (HCC)   Nausea & vomiting   Hypomagnesemia   Back pain   Psoriatic arthritis (HCC)   Dehydration   Hypercalcemia   AKI (acute kidney injury) (HCC)   Discharge Condition: Stable Diet recommendation: Carb modified  Filed Weights   11/10/20 1936  Weight: 84.5 kg    History of present illness:  55 yo female with the past medical history of psoriasis, T2DM, Cushing's syndrome who presentedwith intractable nausea, vomiting and abdominal pain. Patient has been recently on prednisone for psoriaticarthritis, this medication was taper off over 4 weeks. About 14 days after stopping steroids she developed poor appetite and nausea, symptoms progressed to vomiting and not able to hold anything down. On her initial physical examination her temperature was 100.2 F, blood pressure 155/101, heart rate 136, respiratory 22, oxygen saturation 90% on room air.  Hospital Course:   Nausea and vomiting -Improved with supportive care, AS she had been on steroids previously gastritis was considered, she underwent an endoscopy yesterday which was normal. -Symptoms have improved at this time, tolerating diet and anxious to go home. -I picked up her care from my partner today and would have considered a cosyntropin stim test had she stayed longer, however this can be considered as outpatient if she has recurrence of symptoms, she has also started seeing an endocrinologist at [facility] recently.  Acute on chronic back pain, spinal stenosis -MRI noted spinal  stenosis, no fractures -She started on hydrocodone and Flexeril with improvement in symptoms  Uncontrolled type 2 diabetes mellitus with hyperglycemia -Actos metformin and Levemir resumed  Hypercalcemia, hypomagnesemia -Corrected  Psoriatic arthritis -No signs of flareup at this time, follow-up with rheumatology -on methotrexate and Simponi infusions every 8 weeks  Procedures:  Endoscopy Impression:               - No cause for nausea/vomiting endoscopically                            evident. No NSAID gastropathy.                           - Normal esophagus.                           - Normal stomach.                           - Normal examined duodenum.                           - Biopsies were taken with a cold forceps for                            histology in the gastric antrum and in the second  portion of the duodenum.   Consultations:  Gastroenterology Dr. Matthias Hughs  Discharge Exam: Vitals:   11/14/20 0531 11/14/20 1244  BP: 120/68 111/60  Pulse: 82 77  Resp: 16 16  Temp: 98.2 F (36.8 C) 98.2 F (36.8 C)  SpO2: 92% 100%    General: Awake alert oriented x3, no distress Cardiovascular: S1-S2, regular rate rhythm Respiratory: Clear  Discharge Instructions   Discharge Instructions    Diet - low sodium heart healthy   Complete by: As directed    Increase activity slowly   Complete by: As directed      Allergies as of 11/14/2020   No Known Allergies     Medication List    TAKE these medications   albuterol 108 (90 Base) MCG/ACT inhaler Commonly known as: VENTOLIN HFA Inhale 2 puffs into the lungs every 4 (four) hours as needed for wheezing or shortness of breath.   atorvastatin 10 MG tablet Commonly known as: LIPITOR Take 10 mg by mouth at bedtime.   benzonatate 100 MG capsule Commonly known as: TESSALON Take 1 capsule (100 mg total) by mouth 3 (three) times daily as needed for cough.   folic acid 1 MG  tablet Commonly known as: FOLVITE Take 1 mg by mouth daily.   glipiZIDE 10 MG 24 hr tablet Commonly known as: GLUCOTROL XL Take 10 mg by mouth 2 (two) times daily.   hydrochlorothiazide 25 MG tablet Commonly known as: HYDRODIURIL Take 1 tablet (25 mg total) by mouth daily.   HYDROcodone-acetaminophen 5-325 MG tablet Commonly known as: NORCO/VICODIN Take 1 tablet by mouth every 6 (six) hours as needed for moderate pain or severe pain.   insulin detemir 100 UNIT/ML injection Commonly known as: LEVEMIR Inject 10 Units into the skin every evening.   latanoprost 0.005 % ophthalmic solution Commonly known as: XALATAN Place 1 drop into both eyes at bedtime.   lisinopril 5 MG tablet Commonly known as: ZESTRIL Take 5 mg by mouth daily.   metFORMIN 500 MG 24 hr tablet Commonly known as: GLUCOPHAGE-XR Take 1,000 mg by mouth 2 (two) times daily with a meal.   methotrexate 25 MG/ML injection Inject 25 mg into the vein once a week. Pt uses on Friday.   metoprolol succinate 100 MG 24 hr tablet Commonly known as: TOPROL-XL Take 100 mg by mouth 2 (two) times daily.   ondansetron 4 MG disintegrating tablet Commonly known as: Zofran ODT 4mg  ODT q4 hours prn nausea/vomit   PARoxetine 10 MG tablet Commonly known as: PAXIL Take 10 mg by mouth daily.   pioglitazone 15 MG tablet Commonly known as: ACTOS Take 15 mg by mouth daily.   Simponi Aria 50 MG/4ML Soln injection Generic drug: golimumab Inject 2,500 mg into the vein every 8 (eight) weeks. Patient getting 200 ml every 8 weeks.      No Known Allergies  Follow-up Information    Slatosky, ., MD. Schedule an appointment as soon as possible for a visit in 1 week(s).   Specialty: Family Medicine Contact information: [address] [place] [phone]                The results of significant diagnostics from this hospitalization (including imaging, microbiology, ancillary and laboratory) are  listed below for reference.    Significant Diagnostic Studies: MR LUMBAR SPINE WO CONTRAST  Result Date: 11/11/2020 CLINICAL DATA:  55 year old female with increasing low back pain times 10 days. Pain radiating to the midthigh of both legs. No known  injury. EXAM: MRI LUMBAR SPINE WITHOUT CONTRAST TECHNIQUE: Multiplanar, multisequence MR imaging of the lumbar spine was performed. No intravenous contrast was administered. COMPARISON:  CTA Chest, Abdomen, and Pelvis 11/10/2020. FINDINGS: Segmentation: Transitional anatomy. Hypoplastic ribs at T12 but only 4 lumbar type vertebral bodies. Therefore fully sacralized L5 level with vestigial L5-S1 disc is designated. Correlation with radiographs is recommended prior to any operative intervention. Alignment: Straightening of lumbar lordosis stable to that seen yesterday. No spondylolisthesis. Vertebrae: No marrow edema or evidence of acute osseous abnormality. Visualized bone marrow signal is within normal limits. Intact visible sacrum and SI joints. Conus medullaris and cauda equina: Conus extends to the T12 level. No lower spinal cord or conus signal abnormality. Paraspinal and other soft tissues: Stable, negative visible abdominal and pelvic viscera, paraspinal soft tissues. Disc levels: Congenital spinal canal narrowing related to short pedicle distance. T10-T11: Negative disc but moderate posterior element hypertrophy with mild spinal stenosis evident on series 6, image 8. Up to mild spinal cord mass effect but no cord signal abnormality. T11-T12: Mild facet hypertrophy. Borderline to mild spinal stenosis overall. T12-L1: Mild posterior disc bulging and epidural lipomatosis. Mild spinal stenosis. L1-L2: Disc desiccation with left eccentric disc bulging. Epidural lipomatosis and mild posterior element hypertrophy. Moderate spinal stenosis overall. L2-L3: Mild disc desiccation with circumferential disc bulge. Mild to moderate facet hypertrophy and epidural  lipomatosis. Severe spinal stenosis. Mild to moderate bilateral L2 foraminal stenosis related to foraminal disc and endplate spurring. L3-L4: Epidural lipomatosis abates at this level. Mild far lateral disc bulging. Mild posterior element hypertrophy. No significant stenosis. L4-L5: Congenital spinal canal narrowing abates at this level. Subtle rightward disc bulging. Moderate facet hypertrophy. Mild right L4 foraminal stenosis. L5-S1:  Sacralized, negative. IMPRESSION: 1. 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 Correlation with radiographs is recommended prior to any operative intervention. 2. No acute osseous abnormality in the lumbar spine. Normal visible bone marrow signal. 3. 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 Up to mild lower thoracic spinal cord mass effect but no cord signal abnormality. 4. Up to moderate bilateral L2 and mild right L4 degenerative neural foraminal stenosis. Electronically Signed   By: Odessa Fleming M.D.   On: 11/11/2020 11:04   DG Chest Port 1 View  Result Date: 11/10/2020 CLINICAL DATA:  Fever and vomiting. EXAM: PORTABLE CHEST 1 VIEW COMPARISON:  06/13/2019 and prior radiographs FINDINGS: The cardiomediastinal silhouette is unremarkable. There is no evidence of focal airspace disease, pulmonary edema, suspicious pulmonary nodule/mass, pleural effusion, or pneumothorax. No acute bony abnormalities are identified. IMPRESSION: No active disease. Electronically Signed   By: Harmon Pier M.D.   On: 11/10/2020 14:17   CT Angio Chest/Abd/Pel for Dissection W and/or W/WO  Result Date: 11/10/2020 CLINICAL DATA:  Mid back pain, mid abdominal pain. EXAM: CT ANGIOGRAPHY CHEST, ABDOMEN AND PELVIS TECHNIQUE: Non-contrast CT of the chest was initially obtained. Multidetector CT imaging through the chest, abdomen and pelvis was performed using the standard protocol during bolus  administration of intravenous contrast. Multiplanar reconstructed images and MIPs were obtained and reviewed to evaluate the vascular anatomy. CONTRAST:  75mL OMNIPAQUE IOHEXOL 350 MG/ML SOLN COMPARISON:  08/31/2010 FINDINGS: CTA CHEST FINDINGS Cardiovascular: Heart is normal size. Moderate descending aortic calcifications. No aneurysm or dissection. Mediastinum/Nodes: No mediastinal, hilar, or axillary adenopathy. Trachea and esophagus are unremarkable. Bilateral thyroid nodules including 3 cm nodule in the left thyroid lobe. Lungs/Pleura: Small nodule in the left  apex measures 4 mm, new since 2012. Right lower lobe nodule on image 72 measures 9 mm. Right lower lobe nodule on image 64 measures 4 mm. 2 mm nodule on image 78 in the right lower lobe. 2 mm nodule posteriorly in the left lower lobe on image 62. 5 mm nodule in the right lower lobe on image 89. 2 mm nodule in the right lower lobe on image 90. No effusions. Musculoskeletal: Chest wall soft tissues are unremarkable. No acute bony abnormality. Review of the MIP images confirms the above findings. CTA ABDOMEN AND PELVIS FINDINGS VASCULAR Aorta: Aortic atherosclerosis.  No aneurysm or dissection. Celiac: Patent without evidence of aneurysm, dissection, vasculitis or significant stenosis. SMA: Patent without evidence of aneurysm, dissection, vasculitis or significant stenosis. Renals: Both renal arteries are patent without evidence of aneurysm, dissection, vasculitis, fibromuscular dysplasia or significant stenosis. IMA: Patent without evidence of aneurysm, dissection, vasculitis or significant stenosis. Inflow: Patent without evidence of aneurysm, dissection, vasculitis or significant stenosis. Veins: No obvious venous abnormality within the limitations of this arterial phase study. Review of the MIP images confirms the above findings. NON-VASCULAR Hepatobiliary: 1 diffuse low-density throughout the liver compatible with fatty infiltration. No focal  abnormality. Gallbladder unremarkable. Pancreas: No focal abnormality or ductal dilatation. Spleen: No focal abnormality.  Normal size. Adrenals/Urinary Tract: No adrenal abnormality. No focal renal abnormality. No stones or hydronephrosis. Urinary bladder is unremarkable. Stomach/Bowel: Stomach, large and small bowel grossly unremarkable. Lymphatic: No adenopathy Reproductive: Probable small fibroids within the uterus. No adnexal mass. Other: No free fluid or free air. Musculoskeletal: No acute bony abnormality. Review of the MIP images confirms the above findings. IMPRESSION: No evidence of aortic aneurysm or dissection. Bilateral thyroid nodules, the largest 3 cm in the left thyroid lobe. Recommend thyroid US (ref: J Am Coll Radiol. 2015 Feb;12(2): 143-50). Numerous bilateral pulmonary nodules, measuring up to 9 mm in the right lower lobe. These are new since 2012. Cannot exclude metastatic disease. Non-contrast chest CT at 3-6 months is recommended. If the nodules are stable at time of repeat CT, then future CT at 18-24 months (from today's scan) is considered optional for low-risk patients, but is recommended for high-risk patients. This recommendation follows the consensus statement: Guidelines for Management of Incidental Pulmonary Nodules Detected on CT Images: From the Fleischner Society 2017; Radiology 2017; 284:228-243. Hepatic steatosis. Fibroid uterus. Electronically Signed   By: Charlett Nose M.D.   On: 11/10/2020 15:07    Microbiology: Recent Results (from the past 240 hour(s))  Culture, blood (routine x 2)     Status: None (Preliminary result)   Collection Time: 11/10/20  1:19 PM   Specimen: BLOOD  Result Value Ref Range Status   Specimen Description   Final    BLOOD LEFT ANTECUBITAL Performed at [facility], [address], [place], [place] [address]    Special Requests   Final    BOTTLES DRAWN AEROBIC AND ANAEROBIC Blood Culture results may not be optimal due to an  inadequate volume of blood received in culture bottles Performed at [facility], [address] [place], [place] [address]    Culture   Final    NO GROWTH 4 DAYS Performed at [facility], [address] [place], [place] [address]    Report Status PENDING  Incomplete  Resp Panel by RT-PCR (Flu A&B, Covid) Nasopharyngeal Swab     Status: None   Collection Time: 11/10/20  1:20 PM   Specimen: Nasopharyngeal Swab; Nasopharyngeal(NP)  swabs in vial transport medium  Result Value Ref Range Status   SARS Coronavirus 2 by RT PCR NEGATIVE NEGATIVE Final    Comment: (NOTE) SARS-CoV-2 target nucleic acids are NOT DETECTED.  The SARS-CoV-2 RNA is generally detectable in upper respiratory specimens during the acute phase of infection. The lowest concentration of SARS-CoV-2 viral copies this assay can detect is 138 copies/mL. A negative result does not preclude SARS-Cov-2 infection and should not be used as the sole basis for treatment or other patient management decisions. A negative result may occur with  improper specimen collection/handling, submission of specimen other than nasopharyngeal swab, presence of viral mutation(s) within the areas targeted by this assay, and inadequate number of viral copies(<138 copies/mL). A negative result must be combined with clinical observations, patient history, and epidemiological information. The expected result is Negative.  Fact Sheet for Patients:  BloggerCourse.comhttps://www.fda.gov/media/152166/download  Fact Sheet for Healthcare Providers:  SeriousBroker.ithttps://www.fda.gov/media/152162/download  This test is no t yet approved or cleared by the [place] FDA and  has been authorized for detection and/or diagnosis of SARS-CoV-2 by FDA under an Emergency Use Authorization (EUA). This EUA will remain  in effect (meaning this test can be used) for the duration of the COVID-19 declaration under Section 564(b)(1) of the Act, 21 U.S.C.section  360bbb-3(b)(1), unless the authorization is terminated  or revoked sooner.       Influenza A by PCR NEGATIVE NEGATIVE Final   Influenza B by PCR NEGATIVE NEGATIVE Final    Comment: (NOTE) The Xpert Xpress SARS-CoV-2/FLU/RSV plus assay is intended as an aid in the diagnosis of influenza from Nasopharyngeal swab specimens and should not be used as a sole basis for treatment. Nasal washings and aspirates are unacceptable for Xpert Xpress SARS-CoV-2/FLU/RSV testing.  Fact Sheet for Patients: BloggerCourse.comhttps://www.fda.gov/media/152166/download  Fact Sheet for Healthcare Providers: SeriousBroker.ithttps://www.fda.gov/media/152162/download  This test is not yet approved or cleared by the [place] FDA and has been authorized for detection and/or diagnosis of SARS-CoV-2 by FDA under an Emergency Use Authorization (EUA). This EUA will remain in effect (meaning this test can be used) for the duration of the COVID-19 declaration under Section 564(b)(1) of the Act, 21 U.S.C. section 360bbb-3(b)(1), unless the authorization is terminated or revoked.  Performed at [facility], [address] [place], [place] [address]   Culture, blood (routine x 2)     Status: None (Preliminary result)   Collection Time: 11/10/20  1:24 PM   Specimen: BLOOD  Result Value Ref Range Status   Specimen Description   Final    BLOOD RIGHT ANTECUBITAL Performed at [facility], [address] [place], [place] [address]    Special Requests   Final    BOTTLES DRAWN AEROBIC ONLY Blood Culture adequate volume Performed at [facility], [address] [place], [place] [address]    Culture   Final    NO GROWTH 4 DAYS Performed at [facility], [address] [place], [place] [address]    Report Status PENDING  Incomplete     Labs: Basic Metabolic Panel: Recent Labs  Lab 11/10/20 1318 11/10/20 1641 11/11/20 1430 11/12/20 0631 11/13/20 0545  NA 136  --  136  137 138  K 4.2  --  4.9 3.4* 3.6  CL 96*  --  98 101 101  CO2 21*  --  32 29 26  GLUCOSE 174*  --  143* 133* 159*  BUN 18  --  13 12 10   CREATININE  1.55*  --  1.24* 1.07* 1.00  CALCIUM 11.4*  --  10.7*  10.5* 10.4* 10.2  MG  --  1.1*  --   --   --   PHOS  --  4.3  --   --   --    Liver Function Tests: Recent Labs  Lab 11/10/20 1318 11/11/20 1430  AST 46* 48*  ALT 20 16  ALKPHOS 53 42  BILITOT 1.8* 1.1  PROT 7.5 5.8*  ALBUMIN 4.4 3.3*   Recent Labs  Lab 11/10/20 1318  LIPASE 52*   No results for input(s): AMMONIA in the last 168 hours. CBC: Recent Labs  Lab 11/10/20 1318 11/11/20 1430  WBC 6.1 4.4  NEUTROABS 3.1  --   HGB 13.7 11.8*  HCT 41.1 34.6*  MCV 85.6 86.5  PLT 291 270   Cardiac Enzymes: No results for input(s): CKTOTAL, CKMB, CKMBINDEX, TROPONINI in the last 168 hours. BNP: BNP (last 3 results) No results for input(s): BNP in the last 8760 hours.  ProBNP (last 3 results) No results for input(s): PROBNP in the last 8760 hours.  CBG: Recent Labs  Lab 11/13/20 1328 11/13/20 1612 11/13/20 2212 11/14/20 0716 11/14/20 1157  GLUCAP 141* 128* 143* 156* 196*       Signed:  Zannie Cove MD.  [facility] 11/14/2020, 4:18 PM

## 2020-11-15 LAB — CULTURE, BLOOD (ROUTINE X 2)
Culture: NO GROWTH
Culture: NO GROWTH
Special Requests: ADEQUATE

## 2020-12-23 ENCOUNTER — Encounter (HOSPITAL_COMMUNITY): Payer: Self-pay

## 2020-12-23 ENCOUNTER — Emergency Department (HOSPITAL_COMMUNITY)
Admission: EM | Admit: 2020-12-23 | Discharge: 2020-12-23 | Disposition: A | Discharge status: Home / Self Care | Payer: Medicare Other | Source: Home / Self Care | Attending: Emergency Medicine | Admitting: Emergency Medicine

## 2020-12-23 ENCOUNTER — Emergency Department (HOSPITAL_COMMUNITY)
Admission: EM | Admit: 2020-12-23 | Discharge: 2020-12-23 | Disposition: A | Discharge status: Home / Self Care | Payer: Medicare Other | Attending: Emergency Medicine | Admitting: Emergency Medicine

## 2020-12-23 ENCOUNTER — Other Ambulatory Visit: Payer: Self-pay

## 2020-12-23 DIAGNOSIS — M25571 Pain in right ankle and joints of right foot: Secondary | ICD-10-CM | POA: Insufficient documentation

## 2020-12-23 DIAGNOSIS — E119 Type 2 diabetes mellitus without complications: Secondary | ICD-10-CM | POA: Insufficient documentation

## 2020-12-23 DIAGNOSIS — M79672 Pain in left foot: Secondary | ICD-10-CM | POA: Insufficient documentation

## 2020-12-23 DIAGNOSIS — Z7984 Long term (current) use of oral hypoglycemic drugs: Secondary | ICD-10-CM | POA: Insufficient documentation

## 2020-12-23 DIAGNOSIS — M79641 Pain in right hand: Secondary | ICD-10-CM | POA: Insufficient documentation

## 2020-12-23 DIAGNOSIS — M25572 Pain in left ankle and joints of left foot: Secondary | ICD-10-CM | POA: Insufficient documentation

## 2020-12-23 DIAGNOSIS — M25532 Pain in left wrist: Secondary | ICD-10-CM | POA: Insufficient documentation

## 2020-12-23 DIAGNOSIS — M79673 Pain in unspecified foot: Secondary | ICD-10-CM | POA: Insufficient documentation

## 2020-12-23 DIAGNOSIS — Z794 Long term (current) use of insulin: Secondary | ICD-10-CM | POA: Insufficient documentation

## 2020-12-23 DIAGNOSIS — M79642 Pain in left hand: Secondary | ICD-10-CM | POA: Insufficient documentation

## 2020-12-23 DIAGNOSIS — M25531 Pain in right wrist: Secondary | ICD-10-CM | POA: Insufficient documentation

## 2020-12-23 DIAGNOSIS — Z5321 Procedure and treatment not carried out due to patient leaving prior to being seen by health care provider: Secondary | ICD-10-CM | POA: Insufficient documentation

## 2020-12-23 DIAGNOSIS — M79671 Pain in right foot: Secondary | ICD-10-CM | POA: Insufficient documentation

## 2020-12-23 HISTORY — DX: Spinal stenosis, site unspecified: M48.00

## 2020-12-23 MED ORDER — OXYCODONE HCL 5 MG PO TABS: 5.0000 mg | ORAL_TABLET | ORAL | 0 refills | Status: DC | PRN

## 2020-12-23 MED ORDER — DEXAMETHASONE SODIUM PHOSPHATE 10 MG/ML IJ SOLN
10.0000 mg | Freq: Once | INTRAMUSCULAR | Status: AC
  Administered 2020-12-23: 10 mg via INTRAMUSCULAR
  Filled 2020-12-23: qty 1

## 2020-12-23 MED ORDER — OXYCODONE HCL 5 MG PO TABS
10.0000 mg | ORAL_TABLET | Freq: Once | ORAL | Status: AC
  Administered 2020-12-23: 10 mg via ORAL
  Filled 2020-12-23: qty 2

## 2020-12-23 MED ORDER — KETOROLAC TROMETHAMINE 30 MG/ML IJ SOLN
15.0000 mg | Freq: Once | INTRAMUSCULAR | Status: AC
  Administered 2020-12-23: 15 mg via INTRAMUSCULAR
  Filled 2020-12-23: qty 1

## 2020-12-23 NOTE — ED Notes (Signed)
Pt left without being seen.

## 2020-12-23 NOTE — ED Triage Notes (Signed)
Patient reports that she has psoriatic arthritis in her hands and feet and states she is in pain.  Patient states she went to a rheumatologist twice last week and reported that she received Kenalog and Toradol injections. Patient states that worked for a few days, but then the pain returned. Patient states that she is starting to have pain all over today. Patient states that she is unable to eat due to pain.

## 2020-12-23 NOTE — ED Provider Notes (Signed)
[facility]-EMERGENCY DEPT Provider Note   CSN: [id number] Arrival date & time: 12/23/20  1344     History Chief Complaint  Patient presents with   Hand Pain   Foot Pain    Melody Carpenter is a 55 y.o. female.   Hand Pain Pertinent negatives include no chest pain, no abdominal pain and no shortness of breath.  Foot Pain Pertinent negatives include no chest pain, no abdominal pain and no shortness of breath.      Melody Carpenter is a 55 y.o. female, with a history of psoriatic arthritis, DM, presenting to the ED with pain in the hands and feet for the last couple weeks.  Her pain is aching, radiating into the wrists and ankles, severe, constant.  She states this pain feels like a flare of her psoriatic arthritis.  She was seen by her rheumatologist Dr. Luanna Cole, on Thursday, June 30, given IM injections of Toradol and a steroid, and states her pain improved by the next day.  Her pain began to return again last night. She states earlier this year she was tapered off of her chronic prednisone.  She denies fever/chills, chest pain, shortness of breath, abdominal pain, N/V, numbness, focal weakness, or any other complaints.    Past Medical History:  Diagnosis Date   Arthritis    Arthritis with psoriasis (HCC)    Diabetes mellitus without complication (HCC)    Spinal stenosis     Patient Active Problem List   Diagnosis Date Noted   AKI (acute kidney injury) (HCC)    Gastritis 11/11/2020   Nausea & vomiting 11/10/2020   Hypomagnesemia 11/10/2020   Back pain 11/10/2020   Sinus tachycardia 11/10/2020   Dehydration 11/10/2020   Hypercalcemia 11/10/2020   Lumbar spondylosis 11/07/2020   Uncontrolled type 2 diabetes mellitus with hyperglycemia (HCC) 06/10/2018   Hyperglycemic hyperosmolar nonketotic coma (HCC) 06/18/2012   SOB (shortness of breath) 06/18/2012   Hyponatremia 06/18/2012   Lactic acid acidosis 06/18/2012   Psoriatic arthritis (HCC)  04/02/2011    Past Surgical History:  Procedure Laterality Date   BIOPSY  11/13/2020   Procedure: BIOPSY;  Surgeon: Bernette Redbird, MD;  Location: [facility];  Service: Endoscopy;;   ESOPHAGOGASTRODUODENOSCOPY N/A 11/13/2020   Procedure: ESOPHAGOGASTRODUODENOSCOPY (EGD);  Surgeon: Bernette Redbird, MD;  Location: [facility];  Service: Endoscopy;  Laterality: N/A;     OB History   No obstetric history on file.     Family History  Problem Relation Age of Onset   Hypertension Mother    Diabetes Mellitus II Father     Social History   Tobacco Use   Smoking status: Never   Smokeless tobacco: Never  Vaping Use   Vaping Use: Never used  Substance Use Topics   Alcohol use: No   Drug use: No    Home Medications Prior to Admission medications   Medication Sig Start Date End Date Taking? Authorizing Provider  albuterol (PROVENTIL HFA;VENTOLIN HFA) 108 (90 BASE) MCG/ACT inhaler Inhale 2 puffs into the lungs every 4 (four) hours as needed for wheezing or shortness of breath.     [provider]  atorvastatin (LIPITOR) 10 MG tablet Take 10 mg by mouth at bedtime.    [provider]  benzonatate (TESSALON) 100 MG capsule Take 1 capsule (100 mg total) by mouth 3 (three) times daily as needed for cough. Patient not taking: No sig reported 06/13/19   Loren Racer, MD  folic acid (FOLVITE) 1 MG tablet Take 1 mg  by mouth daily.    [provider]  glipiZIDE (GLUCOTROL XL) 10 MG 24 hr tablet Take 10 mg by mouth 2 (two) times daily.    [provider]  golimumab (SIMPONI ARIA) 50 MG/4ML SOLN injection Inject 2,500 mg into the vein every 8 (eight) weeks. Patient getting 200 ml every 8 weeks.    [provider]  hydrochlorothiazide (HYDRODIURIL) 25 MG tablet Take 1 tablet (25 mg total) by mouth daily. 06/22/12   Marinda ElkFeliz Ortiz, Abraham, MD  HYDROcodone-acetaminophen (NORCO/VICODIN) 5-325 MG tablet Take 1 tablet by mouth every 6 (six) hours as  needed for moderate pain or severe pain. 11/14/20   Zannie CoveJoseph, Preetha, MD  insulin detemir (LEVEMIR) 100 UNIT/ML injection Inject 10 Units into the skin every evening.     [provider]  latanoprost (XALATAN) 0.005 % ophthalmic solution Place 1 drop into both eyes at bedtime.     [provider]  lisinopril (PRINIVIL,ZESTRIL) 5 MG tablet Take 5 mg by mouth daily.    [provider]  metFORMIN (GLUCOPHAGE-XR) 500 MG 24 hr tablet Take 1,000 mg by mouth 2 (two) times daily with a meal.     [provider]  methotrexate 25 MG/ML injection Inject 25 mg into the vein once a week. Pt uses on Friday.    [provider]  metoprolol succinate (TOPROL-XL) 100 MG 24 hr tablet Take 100 mg by mouth 2 (two) times daily.    [provider]  ondansetron (ZOFRAN ODT) 4 MG disintegrating tablet 4mg  ODT q4 hours prn nausea/vomit Patient not taking: No sig reported 06/13/19   Loren RacerYelverton, David, MD  PARoxetine (PAXIL) 10 MG tablet Take 10 mg by mouth daily.    [provider]  pioglitazone (ACTOS) 15 MG tablet Take 15 mg by mouth daily.    [provider]    Allergies    Patient has no known allergies.  Review of Systems   Review of Systems  Constitutional:  Negative for chills, diaphoresis and fever.  Respiratory:  Negative for shortness of breath.   Cardiovascular:  Negative for chest pain.  Gastrointestinal:  Negative for abdominal pain, nausea and vomiting.  Musculoskeletal:  Positive for arthralgias. Negative for joint swelling.  Neurological:  Negative for weakness and numbness.  All other systems reviewed and are negative.  Physical Exam Updated Vital Signs BP 113/71 (BP Location: Left Arm)   Pulse 97   Temp 98.4 F (36.9 C)   Resp 18   Ht 5\' 2"  (1.575 m)   Wt 77.1 kg   SpO2 97%   BMI 31.09 kg/m   Physical Exam Vitals and nursing note reviewed.  Constitutional:      General: She is not in acute distress.    Appearance:  She is well-developed. She is not diaphoretic.  HENT:     Head: Normocephalic and atraumatic.     Mouth/Throat:     Mouth: Mucous membranes are moist.     Pharynx: Oropharynx is clear.  Eyes:     Conjunctiva/sclera: Conjunctivae normal.  Cardiovascular:     Rate and Rhythm: Normal rate and regular rhythm.     Pulses: Normal pulses.          Radial pulses are 2+ on the right side and 2+ on the left side.       Posterior tibial pulses are 2+ on the right side and 2+ on the left side.     Heart sounds: Normal heart sounds.     Comments:  Tactile temperature in the extremities appropriate and equal bilaterally. Pulmonary:     Effort: Pulmonary effort is normal. No respiratory distress.     Breath sounds: Normal breath sounds.  Abdominal:     Palpations: Abdomen is soft.     Tenderness: There is no abdominal tenderness. There is no guarding.  Musculoskeletal:     Cervical back: Normal range of motion and neck supple. No tenderness.     Right lower leg: No edema.     Left lower leg: No edema.     Comments: Pain and stiffness with attempted range of motion of the fingers and toes. Psoriasis type plaques on the arms and legs. No noted discrete erythema, swelling, pain out of proportion.  Lymphadenopathy:     Cervical: No cervical adenopathy.  Skin:    General: Skin is warm and dry.     Capillary Refill: Capillary refill takes less than 2 seconds.  Neurological:     Mental Status: She is alert.  Psychiatric:        Mood and Affect: Mood and affect normal.        Speech: Speech normal.        Behavior: Behavior normal.    ED Results / Procedures / Treatments   Labs (all labs ordered are listed, but only abnormal results are displayed) Labs Reviewed - No data to display  EKG None  Radiology No results found.  Procedures Procedures   Medications Ordered in ED Medications  ketorolac (TORADOL) 30 MG/ML injection 15 mg (15 mg Intramuscular Given 12/23/20 1620)  dexamethasone  (DECADRON) injection 10 mg (10 mg Intramuscular Given 12/23/20 1621)  oxyCODONE (Oxy IR/ROXICODONE) immediate release tablet 10 mg (10 mg Oral Given 12/23/20 1621)    ED Course  I have reviewed the triage vital signs and the nursing notes.  Pertinent labs & imaging results that were available during my care of the patient were reviewed by me and considered in my medical decision making (see chart for details).    MDM Rules/Calculators/A&P                          Patient presents with pain in her hands and feet which she states is consistent with psoriatic arthritis flare.  She experienced some temporary relief with injection of Toradol and a steroid. Patient is nontoxic appearing, afebrile, not tachycardic, not tachypneic, not hypotensive, maintains excellent SPO2 on room air, and is in no apparent distress.   I have reviewed the patient's chart to obtain more information.  We will try repeat dosing of Toradol and steroid.  She will need to call to follow-up with her rheumatologist in 2 days after the Fourth of July holiday. She is scheduled for her biologic injection next Thursday. The patient was given instructions for home care as well as return precautions. Patient voices understanding of these instructions, accepts the plan, and is comfortable with discharge.    Patient was admitted on May 21 for generalized pain and further work-up due to cushingoid appearance.  She does not have any of the symptoms with which she presented during that time which included chest and abdominal pain, subjective fever, overall weakness.   Final Clinical Impression(s) / ED Diagnoses Final diagnoses:  Pain in both hands  Pain in both feet    Rx / DC Orders ED Discharge Orders     None        Anselm Pancoast, PA-C 12/23/20 1742  Cheryll Cockayne, MD 12/28/20 [phone]

## 2020-12-23 NOTE — Discharge Instructions (Addendum)
  May use the oxycodone, as needed, for severe pain.  Use caution as this medication can cause drowsiness and increase the chance of falls.  Do not perform dangerous activities while taking this medication.  Recommend follow-up with your rheumatologist soon as possible.  Call the office on Tuesday, July 5 to set up an appointment.

## 2021-08-22 ENCOUNTER — Other Ambulatory Visit: Payer: Self-pay

## 2021-08-22 ENCOUNTER — Other Ambulatory Visit: Payer: Self-pay | Admitting: Neurological Surgery

## 2021-08-22 ENCOUNTER — Encounter (HOSPITAL_COMMUNITY): Payer: Self-pay | Admitting: Neurological Surgery

## 2021-08-22 NOTE — Progress Notes (Signed)
PCP - Dr. Egbert Garibaldi ?Cardiologist - denies ?EKG - 11/12/20 ?Chest x-ray -  ?ECHO -  ?Cardiac Cath -  ?CPAP -  ?Fasting Blood Sugar:  AM 90-98 and PM 120s ?Checks Blood Sugar:  2x/day ?Blood Thinner Instructions:  ?Aspirin Instructions:  ?ERAS Protcol - n/a - clears 10:30 ?COVID TEST- DOS ? ?Anesthesia review: n/a ? ?------------- ? ?SDW INSTRUCTIONS: ? ?Your procedure is scheduled on Friday 3/3. Please report to [facility] Main Entrance "A" at 11:00 A.M., and check in at the Admitting office. Call this number if you have problems the morning of surgery: [phone] ? ? ?Remember: Do not eat after midnight the night before your surgery ? ?You may drink clear liquids until 10:30 AM the morning of your surgery.   ?Clear liquids allowed are: Water, Non-Citrus Juices (without pulp), Carbonated Beverages, Clear Tea, Black Coffee Only, and Gatorade (do NOT add anything to your drinks)  ?  ?Medications to take morning of surgery with a sip of water include: ?atorvastatin (LIPITOR)  ?fluticasone (FLONASE)  ?leflunomide (ARAVA) ?metoprolol succinate (TOPROL-XL)  ?PARoxetine (PAXIL)  ? ?** PLEASE check your blood sugar the morning of your surgery when you wake up and every 2 hours until you get to the Short Stay unit. ? ?If your blood sugar is less than 70 mg/dL, you will need to treat for low blood sugar: ?Do not take insulin. ?Treat a low blood sugar (less than 70 mg/dL) with ? cup of clear juice (cranberry or apple), 4 glucose tablets, OR glucose gel. ?Recheck blood sugar in 15 minutes after treatment (to make sure it is greater than 70 mg/dL). If your blood sugar is not greater than 70 mg/dL on recheck, call [phone] for further instructions. ?glipiZIDE (GLUCOTROL XL)  ?3/2 PM NONE ?3/3 NONE ? ?insulin detemir (LEVEMIR)  ?3/2 5units ?3/3 NONE ? ?metFORMIN (GLUCOPHAGE)  ?3/2 take as usual ?3/3 none ? ?pioglitazone (ACTOS)  ?3/2 take as usual ?3/3 none ? ? ? ?As of today, STOP taking any Aspirin (unless otherwise  instructed by your surgeon), Aleve, Naproxen, Ibuprofen, Motrin, Advil, Goody's, BC's, all herbal medications, fish oil, and all vitamins. ? ?  ?The Morning of Surgery ?Do not wear jewelry, make-up or nail polish. ?Do not wear lotions, powders, or perfumes, or deodorant ?Do not bring valuables to the hospital. ?[facility] is not responsible for any belongings or valuables. ? ?If you are a smoker, DO NOT Smoke 24 hours prior to surgery ? ?If you wear a CPAP at night please bring your mask the morning of surgery  ? ?Remember that you must have someone to transport you home after your surgery, and remain with you for 24 hours if you are discharged the same day. ? ?Please bring cases for contacts, glasses, hearing aids, dentures or bridgework because it cannot be worn into surgery.  ? ?Patients discharged the day of surgery will not be allowed to drive home.  ? ?Please shower the NIGHT BEFORE/MORNING OF SURGERY (use antibacterial soap like DIAL soap if possible). Wear comfortable clothes the morning of surgery. Oral Hygiene is also important to reduce your risk of infection.  Remember - BRUSH YOUR TEETH THE MORNING OF SURGERY WITH YOUR REGULAR TOOTHPASTE ? ?Patient denies shortness of breath, fever, cough and chest pain.  ? ? ?   ? ?

## 2021-08-23 ENCOUNTER — Ambulatory Visit (HOSPITAL_COMMUNITY)
Admission: RE | Admit: 2021-08-23 | Discharge: 2021-08-23 | Disposition: A | Discharge status: Home / Self Care | Payer: Medicare Other | Attending: Neurological Surgery | Admitting: Neurological Surgery

## 2021-08-23 ENCOUNTER — Encounter (HOSPITAL_COMMUNITY)
Admission: RE | Disposition: A | Discharge status: Home / Self Care | Payer: Self-pay | Source: Home / Self Care | Attending: Neurological Surgery

## 2021-08-23 ENCOUNTER — Ambulatory Visit (HOSPITAL_COMMUNITY): Payer: Medicare Other | Admitting: Anesthesiology

## 2021-08-23 ENCOUNTER — Ambulatory Visit (HOSPITAL_BASED_OUTPATIENT_CLINIC_OR_DEPARTMENT_OTHER): Payer: Medicare Other | Admitting: Anesthesiology

## 2021-08-23 ENCOUNTER — Encounter (HOSPITAL_COMMUNITY): Payer: Self-pay | Admitting: Neurological Surgery

## 2021-08-23 DIAGNOSIS — E119 Type 2 diabetes mellitus without complications: Secondary | ICD-10-CM

## 2021-08-23 DIAGNOSIS — T8131XA Disruption of external operation (surgical) wound, not elsewhere classified, initial encounter: Secondary | ICD-10-CM | POA: Diagnosis not present

## 2021-08-23 DIAGNOSIS — E1101 Type 2 diabetes mellitus with hyperosmolarity with coma: Secondary | ICD-10-CM

## 2021-08-23 DIAGNOSIS — T8130XA Disruption of wound, unspecified, initial encounter: Secondary | ICD-10-CM | POA: Diagnosis present

## 2021-08-23 DIAGNOSIS — Z20822 Contact with and (suspected) exposure to covid-19: Secondary | ICD-10-CM | POA: Diagnosis not present

## 2021-08-23 DIAGNOSIS — X58XXXA Exposure to other specified factors, initial encounter: Secondary | ICD-10-CM | POA: Diagnosis not present

## 2021-08-23 HISTORY — PX: LUMBAR WOUND DEBRIDEMENT: SHX1988

## 2021-08-23 LAB — GLUCOSE, CAPILLARY
Glucose-Capillary: 115 mg/dL — ABNORMAL HIGH (ref 70–99)
Glucose-Capillary: 92 mg/dL (ref 70–99)
Glucose-Capillary: 90 mg/dL (ref 70–99)
Glucose-Capillary: 90 mg/dL (ref 70–99)

## 2021-08-23 LAB — SURGICAL PCR SCREEN
MRSA, PCR: NEGATIVE
Staphylococcus aureus: NEGATIVE

## 2021-08-23 LAB — BASIC METABOLIC PANEL
Anion gap: 12 (ref 5–15)
BUN: 23 mg/dL — ABNORMAL HIGH (ref 6–20)
CO2: 22 mmol/L (ref 22–32)
Calcium: 9.4 mg/dL (ref 8.9–10.3)
Chloride: 102 mmol/L (ref 98–111)
Creatinine, Ser: 1.21 mg/dL — ABNORMAL HIGH (ref 0.44–1.00)
GFR, Estimated: 54 mL/min — ABNORMAL LOW (ref >60)
Glucose, Bld: 104 mg/dL — ABNORMAL HIGH (ref 70–99)
Potassium: 3.9 mmol/L (ref 3.5–5.1)
Sodium: 136 mmol/L (ref 135–145)

## 2021-08-23 LAB — CBC
HCT: 40.9 % (ref 36.0–46.0)
Hemoglobin: 13.9 g/dL (ref 12.0–15.0)
MCH: 29.4 pg (ref 26.0–34.0)
MCHC: 34 g/dL (ref 30.0–36.0)
MCV: 86.7 fL (ref 80.0–100.0)
Platelets: 189 10*3/uL (ref 150–400)
RBC: 4.72 MIL/uL (ref 3.87–5.11)
RDW: 13.5 % (ref 11.5–15.5)
WBC: 6.3 10*3/uL (ref 4.0–10.5)
nRBC: 0 % (ref 0.0–0.2)

## 2021-08-23 LAB — SARS CORONAVIRUS 2 BY RT PCR (HOSPITAL ORDER, PERFORMED IN ~~LOC~~ HOSPITAL LAB): SARS Coronavirus 2: NEGATIVE

## 2021-08-23 SURGERY — LUMBAR WOUND DEBRIDEMENT
Anesthesia: General

## 2021-08-23 MED ORDER — BUPIVACAINE HCL (PF) 0.25 % IJ SOLN
INTRAMUSCULAR | Status: DC | PRN
  Administered 2021-08-23: 7 mL

## 2021-08-23 MED ORDER — DEXAMETHASONE SODIUM PHOSPHATE 10 MG/ML IJ SOLN
INTRAMUSCULAR | Status: DC | PRN
  Administered 2021-08-23: 5 mg via INTRAVENOUS

## 2021-08-23 MED ORDER — OXYCODONE HCL 5 MG PO TABS: 5.0000 mg | ORAL_TABLET | Freq: Once | ORAL | Status: DC | PRN

## 2021-08-23 MED ORDER — FENTANYL CITRATE (PF) 100 MCG/2ML IJ SOLN: 25.0000 ug | INTRAMUSCULAR | Status: DC | PRN

## 2021-08-23 MED ORDER — CEFAZOLIN SODIUM-DEXTROSE 2-3 GM-%(50ML) IV SOLR
INTRAVENOUS | Status: DC | PRN
  Administered 2021-08-23: 2 g via INTRAVENOUS

## 2021-08-23 MED ORDER — HYDROCODONE-ACETAMINOPHEN 5-325 MG PO TABS: 1.0000 | ORAL_TABLET | ORAL | 0 refills | Status: AC | PRN

## 2021-08-23 MED ORDER — OXYCODONE HCL 5 MG/5ML PO SOLN: 5.0000 mg | Freq: Once | ORAL | Status: AC | PRN

## 2021-08-23 MED ORDER — VANCOMYCIN HCL 1000 MG IV SOLR
INTRAVENOUS | Status: AC
  Filled 2021-08-23: qty 20

## 2021-08-23 MED ORDER — FENTANYL CITRATE (PF) 250 MCG/5ML IJ SOLN
INTRAMUSCULAR | Status: AC
  Filled 2021-08-23: qty 5

## 2021-08-23 MED ORDER — ONDANSETRON HCL 4 MG/2ML IJ SOLN
INTRAMUSCULAR | Status: DC | PRN
  Administered 2021-08-23: 4 mg via INTRAVENOUS

## 2021-08-23 MED ORDER — BUPIVACAINE HCL (PF) 0.25 % IJ SOLN
INTRAMUSCULAR | Status: DC | PRN
  Administered 2021-08-23: 10 mL

## 2021-08-23 MED ORDER — ROCURONIUM BROMIDE 10 MG/ML (PF) SYRINGE
PREFILLED_SYRINGE | INTRAVENOUS | Status: AC
  Filled 2021-08-23: qty 10

## 2021-08-23 MED ORDER — VANCOMYCIN HCL 1000 MG IV SOLR
INTRAVENOUS | Status: DC | PRN
  Administered 2021-08-23: 1000 mg via TOPICAL

## 2021-08-23 MED ORDER — KETOROLAC TROMETHAMINE 30 MG/ML IJ SOLN: 30.0000 mg | Freq: Once | INTRAMUSCULAR | Status: DC | PRN

## 2021-08-23 MED ORDER — CEFAZOLIN SODIUM-DEXTROSE 2-4 GM/100ML-% IV SOLN
INTRAVENOUS | Status: AC
  Filled 2021-08-23: qty 100

## 2021-08-23 MED ORDER — DEXMEDETOMIDINE (PRECEDEX) IN NS 20 MCG/5ML (4 MCG/ML) IV SYRINGE
PREFILLED_SYRINGE | INTRAVENOUS | Status: DC | PRN
Start: 2021-08-23 — End: 2021-08-23
  Administered 2021-08-23: 12 ug via INTRAVENOUS

## 2021-08-23 MED ORDER — ORAL CARE MOUTH RINSE: 15.0000 mL | Freq: Once | OROMUCOSAL | Status: AC

## 2021-08-23 MED ORDER — LACTATED RINGERS IV SOLN: INTRAVENOUS | Status: DC

## 2021-08-23 MED ORDER — MIDAZOLAM HCL 2 MG/2ML IJ SOLN
INTRAMUSCULAR | Status: AC
  Filled 2021-08-23: qty 2

## 2021-08-23 MED ORDER — CEFAZOLIN SODIUM-DEXTROSE 2-4 GM/100ML-% IV SOLN
2.0000 g | INTRAVENOUS | Status: DC
Start: 2021-08-24 — End: 2021-08-23

## 2021-08-23 MED ORDER — SUGAMMADEX SODIUM 200 MG/2ML IV SOLN
INTRAVENOUS | Status: DC | PRN
Start: 2021-08-23 — End: 2021-08-23
  Administered 2021-08-23: 325 mg via INTRAVENOUS

## 2021-08-23 MED ORDER — OXYCODONE HCL 5 MG PO TABS
ORAL_TABLET | ORAL | Status: AC
  Filled 2021-08-23: qty 1

## 2021-08-23 MED ORDER — DEXMEDETOMIDINE (PRECEDEX) IN NS 20 MCG/5ML (4 MCG/ML) IV SYRINGE
PREFILLED_SYRINGE | INTRAVENOUS | Status: AC
  Filled 2021-08-23: qty 5

## 2021-08-23 MED ORDER — ACETAMINOPHEN 10 MG/ML IV SOLN
INTRAVENOUS | Status: DC | PRN
  Administered 2021-08-23: 1000 mg via INTRAVENOUS

## 2021-08-23 MED ORDER — CHLORHEXIDINE GLUCONATE CLOTH 2 % EX PADS: 6.0000 | MEDICATED_PAD | Freq: Once | CUTANEOUS | Status: DC

## 2021-08-23 MED ORDER — OXYCODONE HCL 5 MG PO TABS
5.0000 mg | ORAL_TABLET | Freq: Once | ORAL | Status: AC | PRN
  Administered 2021-08-23: 5 mg via ORAL

## 2021-08-23 MED ORDER — BUPIVACAINE HCL (PF) 0.25 % IJ SOLN
INTRAMUSCULAR | Status: AC
  Filled 2021-08-23: qty 30

## 2021-08-23 MED ORDER — CHLORHEXIDINE GLUCONATE 0.12 % MT SOLN
15.0000 mL | Freq: Once | OROMUCOSAL | Status: AC
  Administered 2021-08-23: 15 mL via OROMUCOSAL
  Filled 2021-08-23: qty 15

## 2021-08-23 MED ORDER — LIDOCAINE 2% (20 MG/ML) 5 ML SYRINGE
INTRAMUSCULAR | Status: AC
  Filled 2021-08-23: qty 5

## 2021-08-23 MED ORDER — FENTANYL CITRATE (PF) 250 MCG/5ML IJ SOLN
INTRAMUSCULAR | Status: DC | PRN
Start: 2021-08-23 — End: 2021-08-23
  Administered 2021-08-23 (×4): 50 ug via INTRAVENOUS

## 2021-08-23 MED ORDER — PROPOFOL 10 MG/ML IV BOLUS
INTRAVENOUS | Status: DC | PRN
Start: 2021-08-23 — End: 2021-08-23
  Administered 2021-08-23: 50 mg via INTRAVENOUS
  Administered 2021-08-23: 150 mg via INTRAVENOUS

## 2021-08-23 MED ORDER — ONDANSETRON HCL 4 MG/2ML IJ SOLN
INTRAMUSCULAR | Status: AC
  Filled 2021-08-23: qty 2

## 2021-08-23 MED ORDER — PROPOFOL 10 MG/ML IV BOLUS
INTRAVENOUS | Status: AC
  Filled 2021-08-23: qty 20

## 2021-08-23 MED ORDER — ONDANSETRON HCL 4 MG/2ML IJ SOLN: 4.0000 mg | Freq: Once | INTRAMUSCULAR | Status: DC | PRN

## 2021-08-23 MED ORDER — ACETAMINOPHEN 500 MG PO TABS: 1000.0000 mg | ORAL_TABLET | Freq: Once | ORAL | Status: DC | PRN

## 2021-08-23 MED ORDER — ARTIFICIAL TEARS OPHTHALMIC OINT
TOPICAL_OINTMENT | OPHTHALMIC | Status: AC
  Filled 2021-08-23: qty 3.5

## 2021-08-23 MED ORDER — OXYCODONE HCL 5 MG/5ML PO SOLN: 5.0000 mg | Freq: Once | ORAL | Status: DC | PRN

## 2021-08-23 MED ORDER — 0.9 % SODIUM CHLORIDE (POUR BTL) OPTIME
TOPICAL | Status: DC | PRN
  Administered 2021-08-23: 1000 mL

## 2021-08-23 MED ORDER — ACETAMINOPHEN 10 MG/ML IV SOLN: 1000.0000 mg | Freq: Once | INTRAVENOUS | Status: DC | PRN

## 2021-08-23 MED ORDER — INSULIN ASPART 100 UNIT/ML IJ SOLN: 0.0000 [IU] | INTRAMUSCULAR | Status: DC | PRN

## 2021-08-23 MED ORDER — DEXAMETHASONE SODIUM PHOSPHATE 10 MG/ML IJ SOLN
INTRAMUSCULAR | Status: AC
  Filled 2021-08-23: qty 1

## 2021-08-23 MED ORDER — ROCURONIUM BROMIDE 10 MG/ML (PF) SYRINGE
PREFILLED_SYRINGE | INTRAVENOUS | Status: DC | PRN
  Administered 2021-08-23: 60 mg via INTRAVENOUS

## 2021-08-23 MED ORDER — ACETAMINOPHEN 160 MG/5ML PO SOLN: 1000.0000 mg | Freq: Once | ORAL | Status: DC | PRN

## 2021-08-23 MED ORDER — MIDAZOLAM HCL 2 MG/2ML IJ SOLN
INTRAMUSCULAR | Status: DC | PRN
Start: 2021-08-23 — End: 2021-08-23
  Administered 2021-08-23: 2 mg via INTRAVENOUS

## 2021-08-23 MED ORDER — LIDOCAINE 2% (20 MG/ML) 5 ML SYRINGE
INTRAMUSCULAR | Status: DC | PRN
  Administered 2021-08-23: 100 mg via INTRAVENOUS

## 2021-08-23 SURGICAL SUPPLY — 43 items
APL SKNCLS STERI-STRIP NONHPOA (GAUZE/BANDAGES/DRESSINGS) ×1
BAG COUNTER SPONGE SURGICOUNT (BAG) ×3 IMPLANT
BAG SPNG CNTER NS LX DISP (BAG) ×1
BENZOIN TINCTURE PRP APPL 2/3 (GAUZE/BANDAGES/DRESSINGS) ×3 IMPLANT
CANISTER SUCT 3000ML PPV (MISCELLANEOUS) ×3 IMPLANT
DRAPE LAPAROTOMY 100X72X124 (DRAPES) ×3 IMPLANT
DRSG OPSITE POSTOP 4X6 (GAUZE/BANDAGES/DRESSINGS) ×1 IMPLANT
DURAPREP 26ML APPLICATOR (WOUND CARE) IMPLANT
DURAPREP 6ML APPLICATOR 50/CS (WOUND CARE) IMPLANT
ELECT REM PT RETURN 9FT ADLT (ELECTROSURGICAL) ×2
ELECTRODE REM PT RTRN 9FT ADLT (ELECTROSURGICAL) ×2 IMPLANT
GAUZE 4X4 16PLY ~~LOC~~+RFID DBL (SPONGE) IMPLANT
GLOVE SURG ENC MOIS LTX SZ7 (GLOVE) IMPLANT
GLOVE SURG ENC MOIS LTX SZ8 (GLOVE) ×3 IMPLANT
GLOVE SURG UNDER POLY LF SZ7 (GLOVE) IMPLANT
GOWN STRL REUS W/ TWL LRG LVL3 (GOWN DISPOSABLE) IMPLANT
GOWN STRL REUS W/ TWL XL LVL3 (GOWN DISPOSABLE) IMPLANT
GOWN STRL REUS W/TWL 2XL LVL3 (GOWN DISPOSABLE) ×3 IMPLANT
GOWN STRL REUS W/TWL LRG LVL3 (GOWN DISPOSABLE)
GOWN STRL REUS W/TWL XL LVL3 (GOWN DISPOSABLE)
KIT BASIN OR (CUSTOM PROCEDURE TRAY) ×3 IMPLANT
KIT STIMULAN RAPID CURE 5CC (Orthopedic Implant) ×1 IMPLANT
KIT TURNOVER KIT B (KITS) ×3 IMPLANT
NDL HYPO 18GX1.5 BLUNT FILL (NEEDLE) IMPLANT
NDL HYPO 25X1 1.5 SAFETY (NEEDLE) ×2 IMPLANT
NDL SPNL 20GX3.5 QUINCKE YW (NEEDLE) IMPLANT
NEEDLE HYPO 18GX1.5 BLUNT FILL (NEEDLE) IMPLANT
NEEDLE HYPO 25X1 1.5 SAFETY (NEEDLE) ×2 IMPLANT
NEEDLE SPNL 20GX3.5 QUINCKE YW (NEEDLE) IMPLANT
NS IRRIG 1000ML POUR BTL (IV SOLUTION) ×3 IMPLANT
PACK LAMINECTOMY NEURO (CUSTOM PROCEDURE TRAY) ×3 IMPLANT
PAD ARMBOARD 7.5X6 YLW CONV (MISCELLANEOUS) ×9 IMPLANT
STRIP CLOSURE SKIN 1/2X4 (GAUZE/BANDAGES/DRESSINGS) ×3 IMPLANT
SUT VIC AB 0 CT1 18XCR BRD8 (SUTURE) ×2 IMPLANT
SUT VIC AB 0 CT1 8-18 (SUTURE) ×2
SUT VIC AB 2-0 CP2 18 (SUTURE) ×3 IMPLANT
SUT VIC AB 3-0 SH 8-18 (SUTURE) ×3 IMPLANT
SWAB COLLECTION DEVICE MRSA (MISCELLANEOUS) ×3 IMPLANT
SWAB CULTURE ESWAB REG 1ML (MISCELLANEOUS) ×3 IMPLANT
SYR 3ML LL SCALE MARK (SYRINGE) IMPLANT
TOWEL GREEN STERILE (TOWEL DISPOSABLE) ×3 IMPLANT
TOWEL GREEN STERILE FF (TOWEL DISPOSABLE) ×3 IMPLANT
WATER STERILE IRR 1000ML POUR (IV SOLUTION) ×3 IMPLANT

## 2021-08-23 NOTE — Anesthesia Procedure Notes (Signed)
Procedure Name: Intubation ?Date/Time: 08/23/2021 4:29 PM ?Performed by: Reece Agar, CRNA ?Pre-anesthesia Checklist: Patient identified, Emergency Drugs available, Suction available and Patient being monitored ?Patient Re-evaluated:Patient Re-evaluated prior to induction ?Oxygen Delivery Method: Circle System Utilized ?Preoxygenation: Pre-oxygenation with 100% oxygen ?Induction Type: IV induction ?Ventilation: Mask ventilation without difficulty and Oral airway inserted - appropriate to patient size ?Laryngoscope Size: Mac and 4 ?Grade View: Grade II ?Tube type: Oral ?Tube size: 7.0 mm ?Number of attempts: 2 ?Airway Equipment and Method: Stylet and Oral airway ?Placement Confirmation: ETT inserted through vocal cords under direct vision, positive ETCO2 and breath sounds checked- equal and bilateral ?Secured at: 22 cm ?Tube secured with: Tape ?Dental Injury: Teeth and Oropharynx as per pre-operative assessment  ?Comments: MAC 3 with grade 3 view. MAC 4 with grade 2b view by CRNA ? ? ? ? ?

## 2021-08-23 NOTE — Op Note (Signed)
08/23/2021 ? ?5:11 PM ? ?PATIENT:  Melody Carpenter  56 y.o. female ? ?PRE-OPERATIVE DIAGNOSIS: Lumbar wound dehiscence ? ?POST-OPERATIVE DIAGNOSIS:  same ? ?PROCEDURE: Irrigation and debridement of lumbar wound with primary revision of wound measuring 8 cm long x 3 cm deep, using both blunt and sharp dissection ? ?SURGEON:  Marikay Alar, MD ? ?ASSISTANTS: Verlin Dike FNP ? ?ANESTHESIA:   General ? ?EBL: 10 ml ? ?Total I/O ?In: 150 [IV Piggyback:150] ?Out: 10 [Blood:10] ? ?BLOOD ADMINISTERED: none ? ?DRAINS: None ? ?SPECIMEN:  none ? ?INDICATION FOR PROCEDURE: This patient presented with pain around her wound.  On exam her wound had a complete dehiscence down to the fascia.  Recommended irrigation and debridement of the wound with primary revision of the wound. Patient understood the risks, benefits, and alternatives and potential outcomes and wished to proceed. ? ?PROCEDURE DETAILS: The patient was taken to the operating room and after induction of adequate generalized endotracheal anesthesia, the patient was rolled into the prone position on the [facility] frame and all pressure points were padded. The lumbar region was cleaned with Betadine scrub and then prepped with DuraPrep and draped in the usual sterile fashion. 5 cc of local anesthesia was injected and I then ellipsed out the old incision and carried the debridement down to the lumbo sacral fascia. I irrigated with saline solution containing bacitracin.  I both sharply and bluntly debrided the tissues back to nice healthy bleeding tissues.  We cultured the wound.  We irrigated with plenty of saline solution.  We placed vancomycin beads into the wound.  Then closed the fascia with 0 Vicryl. I closed the subcutaneous tissues with 2-0 Vicryl and the subcuticular tissues with 3-0 Vicryl. The skin was then closed with benzoin and Steri-Strips. The drapes were removed, a sterile dressing was applied.  My nurse practitioner was involved in the irrigation and  debridement and the closure. the patient was awakened from general anesthesia and transferred to the recovery room in stable condition. At the end of the procedure all sponge, needle and instrument counts were correct. ? ? ? ?PLAN OF CARE: Discharge to home after PACU ? ?PATIENT DISPOSITION:  PACU - hemodynamically stable. ?  ?Delay start of Pharmacological VTE agent (>24hrs) due to surgical blood loss or risk of bleeding:  yes ? ? ? ? ? ? ? ? ? ? ? ? ? ? ?

## 2021-08-23 NOTE — H&P (Signed)
Subjective: ?Patient is a 56 y.o. female admitted for wound dehiscence. Onset of symptoms was 3 days ago, unchanged since that time.  The pain is rated mild, and is located at the across the lower back. The pain is described as aching and occurs all day. The symptoms have been progressive. Symptoms are exacerbated by nothing in particular. Underwent lum lam 07/15/2021  ? ?Past Medical History:  ?Diagnosis Date  ? Arthritis   ? Arthritis with psoriasis ([place])   ? Diabetes mellitus without complication ([place])   ? Spinal stenosis   ?  ?Past Surgical History:  ?Procedure Laterality Date  ? BIOPSY  11/13/2020  ? Procedure: BIOPSY;  Surgeon: Ronald Lobo, MD;  Location: [facility];  Service: Endoscopy;;  ? ESOPHAGOGASTRODUODENOSCOPY N/A 11/13/2020  ? Procedure: ESOPHAGOGASTRODUODENOSCOPY (EGD);  Surgeon: Ronald Lobo, MD;  Location: [facility];  Service: Endoscopy;  Laterality: N/A;  ?  ?Prior to Admission medications   ?Medication Sig Start Date End Date Taking? Authorizing Provider  ?atorvastatin (LIPITOR) 20 MG tablet Take 20 mg by mouth daily.   Yes [provider]  ?fluticasone (FLONASE) 50 MCG/ACT nasal spray Place 1 spray into both nostrils 2 (two) times daily as needed for allergies. 04/13/21  Yes [provider]  ?glipiZIDE (GLUCOTROL XL) 10 MG 24 hr tablet Take 10 mg by mouth 2 (two) times daily.   Yes [provider]  ?golimumab (SIMPONI ARIA) 50 MG/4ML SOLN injection Inject 2,500 mg into the vein every 8 (eight) weeks. Patient getting 200 ml every 8 weeks.   Yes [provider]  ?hydrochlorothiazide (HYDRODIURIL) 25 MG tablet Take 1 tablet (25 mg total) by mouth daily. 06/22/12  Yes Charlynne Cousins, MD  ?insulin detemir (LEVEMIR) 100 UNIT/ML injection Inject 10 Units into the skin every evening.    Yes [provider]  ?latanoprost (XALATAN) 0.005 % ophthalmic solution Place 1 drop into both eyes at bedtime.    Yes [provider]  ?leflunomide  (ARAVA) 10 MG tablet Take 10 mg by mouth daily. 07/26/21  Yes [provider]  ?lisinopril (PRINIVIL,ZESTRIL) 5 MG tablet Take 5 mg by mouth daily.   Yes [provider]  ?metFORMIN (GLUCOPHAGE) 1000 MG tablet Take 1,000 mg by mouth 2 (two) times daily with a meal.   Yes [provider]  ?metoprolol succinate (TOPROL-XL) 100 MG 24 hr tablet Take 100 mg by mouth daily.   Yes [provider]  ?Multiple Vitamin (MULTIVITAMIN WITH MINERALS) TABS tablet Take 1 tablet by mouth daily.   Yes [provider]  ?PARoxetine (PAXIL) 20 MG tablet Take 20 mg by mouth daily.   Yes [provider]  ?pioglitazone (ACTOS) 15 MG tablet Take 15 mg by mouth daily.   Yes [provider]  ?predniSONE (DELTASONE) 5 MG tablet Take 5 mg by mouth 2 (two) times daily as needed (arthritis). 07/26/21  Yes [provider]  ?albuterol (PROVENTIL HFA;VENTOLIN HFA) 108 (90 BASE) MCG/ACT inhaler Inhale 2 puffs into the lungs every 4 (four) hours as needed for wheezing or shortness of breath.     [provider]  ? ?No Known Allergies  ?Social History  ? ?Tobacco Use  ? Smoking status: Never  ? Smokeless tobacco: Never  ?Substance Use Topics  ? Alcohol use: No  ?  ?Family History  ?Problem Relation Age of Onset  ? Hypertension Mother   ? Diabetes Mellitus II Father   ? ?  ?Review of Systems ? ?Positive ROS: neg ? ?All other  systems have been reviewed and were otherwise negative with the exception of those mentioned in the HPI and as above. ? ?Objective: ?Vital signs in last 24 hours: ?Temp:  [97.4 ?F (36.3 ?C)] 97.4 ?F (36.3 ?C) (03/03 1044) ?Pulse Rate:  [86] 86 (03/03 1044) ?Resp:  [18] 18 (03/03 1044) ?BP: (147)/(82) 147/82 (03/03 1044) ?SpO2:  [98 %] 98 % (03/03 1044) ?Weight:  [77.1 kg] 77.1 kg (03/03 1044) ? ?General Appearance: Alert, cooperative, no distress, appears stated age ?Head: Normocephalic, without obvious abnormality, atraumatic ?Eyes: PERRL,  conjunctiva/corneas clear, EOM's intact    ?Neck: Supple, symmetrical, trachea midline ?Back: Symmetric, no curvature, ROM normal, no CVA tenderness ?Lungs:  respirations unlabored ?Heart: Regular rate and rhythm ?Abdomen: Soft, non-tender ?Extremities: Extremities normal, atraumatic, no cyanosis or edema ?Pulses: 2+ and symmetric all extremities ?Skin: Skin color, texture, turgor normal, no rashes or lesions ? ?NEUROLOGIC:  ? ?Mental status: Alert and oriented x4,  no aphasia, good attention span, fund of knowledge, and memory ?Motor Exam - grossly normal ?Sensory Exam - grossly normal ?Reflexes: 1+ ?Coordination - grossly normal ?Gait - grossly normal ?Balance - grossly normal ?Cranial Nerves: ?I: smell Not tested  ?II: visual acuity  OS: nl    OD: nl  ?II: visual fields Full to confrontation  ?II: pupils Equal, round, reactive to light  ?III,VII: ptosis None  ?III,IV,VI: extraocular muscles  Full ROM  ?V: mastication Normal  ?V: facial light touch sensation  Normal  ?V,VII: corneal reflex  Present  ?VII: facial muscle function - upper  Normal  ?VII: facial muscle function - lower Normal  ?VIII: hearing Not tested  ?IX: soft palate elevation  Normal  ?IX,X: gag reflex Present  ?XI: trapezius strength  5/5  ?XI: sternocleidomastoid strength 5/5  ?XI: neck flexion strength  5/5  ?XII: tongue strength  Normal  ? ? ?Data Review ?Lab Results  ?Component Value Date  ? WBC 6.3 08/23/2021  ? HGB 13.9 08/23/2021  ? HCT 40.9 08/23/2021  ? MCV 86.7 08/23/2021  ? PLT 189 08/23/2021  ? ?Lab Results  ?Component Value Date  ? NA 138 11/13/2020  ? K 3.6 11/13/2020  ? CL 101 11/13/2020  ? CO2 26 11/13/2020  ? BUN 10 11/13/2020  ? CREATININE 1.00 11/13/2020  ? GLUCOSE 159 (H) 11/13/2020  ? ?No results found for: INR, PROTIME ? ?Assessment/Plan: ? ?Estimated body mass index is 31.09 kg/m? as calculated from the following: ?  Height as of this encounter: 5\' 2"  (1.575 m). ?  Weight as of this encounter: 77.1 kg. ?Patient admitted for  lumbar wound revision. Patient has failed a reasonable attempt at conservative therapy. ? ?I explained the condition and procedure to the patient and answered any questions.  Patient wishes to proceed with procedure as planned. Understands risks/ benefits and typical outcomes of procedure. ? ? ?Eustace Moore ?08/23/2021 11:54 AM ? ?

## 2021-08-23 NOTE — Anesthesia Postprocedure Evaluation (Signed)
Anesthesia Post Note ? ?Patient: Melody Carpenter ? ?Procedure(s) Performed: Lumbar wound revision ? ?  ? ?Patient location during evaluation: PACU ?Anesthesia Type: General ?Level of consciousness: awake and alert ?Pain management: pain level controlled ?Vital Signs Assessment: post-procedure vital signs reviewed and stable ?Respiratory status: spontaneous breathing, nonlabored ventilation, respiratory function stable and patient connected to nasal cannula oxygen ?Cardiovascular status: blood pressure returned to baseline and stable ?Postop Assessment: no apparent nausea or vomiting ?Anesthetic complications: no ? ? ?No notable events documented. ? ?Last Vitals:  ?Vitals:  ? 08/23/21 1044 08/23/21 1720  ?BP: (!) 147/82 126/68  ?Pulse: 86 81  ?Resp: 18 16  ?Temp: (!) 36.3 ?C 36.7 ?C  ?SpO2: 98% 100%  ?  ?Last Pain:  ?Vitals:  ? 08/23/21 1720  ?TempSrc:   ?PainSc: 6   ? ? ?  ?  ?  ?  ?  ?  ? ?Melody Carpenter S ? ? ? ? ?

## 2021-08-23 NOTE — Anesthesia Preprocedure Evaluation (Signed)
Anesthesia Evaluation  °Patient identified by MRN, date of birth, ID band °Patient awake ° ° ° °Reviewed: °Allergy & Precautions, NPO status , Patient's Chart, lab work & pertinent test results ° °Airway °Mallampati: II ° °TM Distance: >3 FB °Neck ROM: Full ° ° ° Dental °no notable dental hx. ° °  °Pulmonary °neg pulmonary ROS,  °  °Pulmonary exam normal °breath sounds clear to auscultation ° ° ° ° ° ° Cardiovascular °negative cardio ROS °Normal cardiovascular exam °Rhythm:Regular Rate:Normal ° ° °  °Neuro/Psych °negative neurological ROS ° negative psych ROS  ° GI/Hepatic °negative GI ROS, Neg liver ROS,   °Endo/Other  °diabetes, Type 2 ° Renal/GU °negative Renal ROS  °negative genitourinary °  °Musculoskeletal °negative musculoskeletal ROS °(+)  ° Abdominal °  °Peds °negative pediatric ROS °(+)  Hematology °negative hematology ROS °(+)   °Anesthesia Other Findings ° ° Reproductive/Obstetrics °negative OB ROS ° °  ° ° ° ° ° ° ° ° ° ° ° ° ° °  °  ° ° ° ° ° ° ° ° °Anesthesia Physical °Anesthesia Plan ° °ASA: 2 ° °Anesthesia Plan: General  ° °Post-op Pain Management: Dilaudid IV  ° °Induction: Intravenous ° °PONV Risk Score and Plan: 3 and Ondansetron, Dexamethasone and Treatment may vary due to age or medical condition ° °Airway Management Planned: Oral ETT ° °Additional Equipment:  ° °Intra-op Plan:  ° °Post-operative Plan: Extubation in OR ° °Informed Consent: I have reviewed the patients History and Physical, chart, labs and discussed the procedure including the risks, benefits and alternatives for the proposed anesthesia with the patient or authorized representative who has indicated his/her understanding and acceptance.  ° ° ° °Dental advisory given ° °Plan Discussed with: CRNA and Surgeon ° °Anesthesia Plan Comments:   ° ° ° ° ° ° °Anesthesia Quick Evaluation ° °

## 2021-08-23 NOTE — Transfer of Care (Signed)
Immediate Anesthesia Transfer of Care Note ? ?Patient: Melody Carpenter ? ?Procedure(s) Performed: Lumbar wound revision ? ?Patient Location: PACU ? ?Anesthesia Type:General ? ?Level of Consciousness: awake and alert  ? ?Airway & Oxygen Therapy: Patient Spontanous Breathing and Patient connected to face mask oxygen ? ?Post-op Assessment: Report given to RN, Post -op Vital signs reviewed and stable and Patient moving all extremities X 4 ? ?Post vital signs: Reviewed and stable ? ?Last Vitals:  ?Vitals Value Taken Time  ?BP 126/68 08/23/21 1721  ?Temp    ?Pulse 80 08/23/21 1722  ?Resp 8 08/23/21 1722  ?SpO2 100 % 08/23/21 1722  ?Vitals shown include unvalidated device data. ? ?Last Pain:  ?Vitals:  ? 08/23/21 1111  ?TempSrc:   ?PainSc: 0-No pain  ?   ? ?  ? ?Complications: No notable events documented. ?

## 2021-08-26 ENCOUNTER — Encounter (HOSPITAL_COMMUNITY): Payer: Self-pay | Admitting: Neurological Surgery

## 2021-08-28 LAB — AEROBIC/ANAEROBIC CULTURE W GRAM STAIN (SURGICAL/DEEP WOUND): Gram Stain: NONE SEEN

## 2021-10-27 IMAGING — MR MR LUMBAR SPINE W/O CM
5 of 6 series · 31 of 48 positions shown · non-contrast
Comparison: CTA Chest, Abdomen, and Pelvis 11/10/2020.

CLINICAL DATA: 54-year-old female with increasing low back pain
times 10 days. Pain radiating to the midthigh of both legs. No known
injury.

EXAM:
MRI LUMBAR SPINE WITHOUT CONTRAST
TECHNIQUE: Multiplanar, multisequence MR imaging of the lumbar spine was
performed. No intravenous contrast was administered.

[Series 5: T1 · sagittal · 4.0mm · 0.81mm/px · 5 of 17 slices shown (1 of 2)]
[im 1/17]
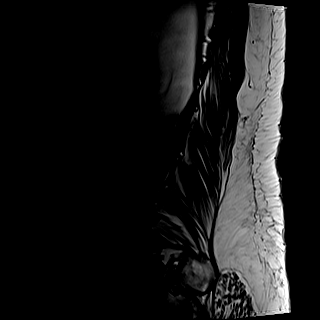
[im 5/17]
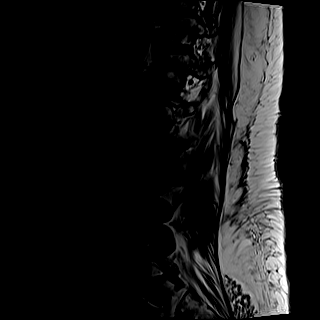
[im 9/17]
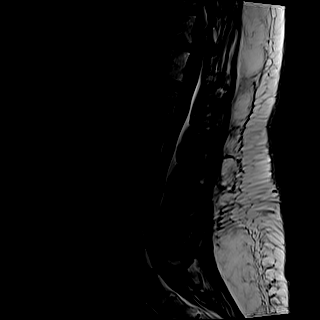
[im 13/17]
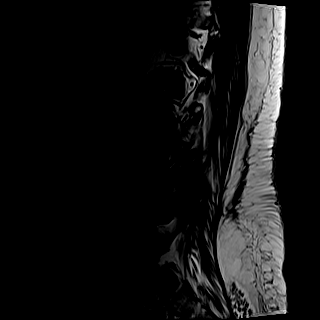
[im 17/17]
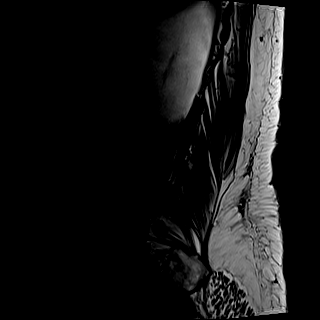

[Series 6: T2 · sagittal · 4.0mm · 0.81mm/px · 5 of 17 slices shown (1 of 3)]
[im 1/17]
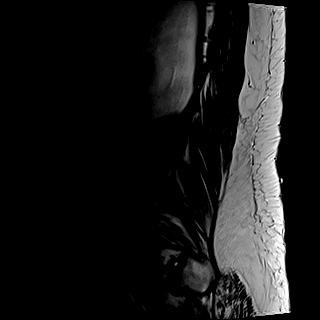
[im 5/17]
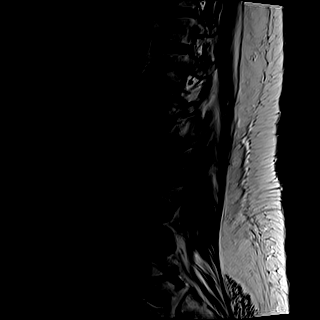
[im 9/17]
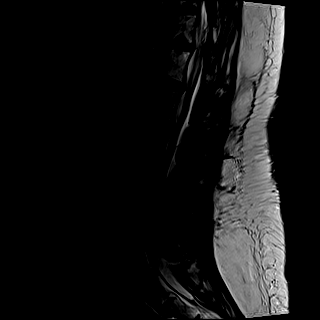
[im 13/17]
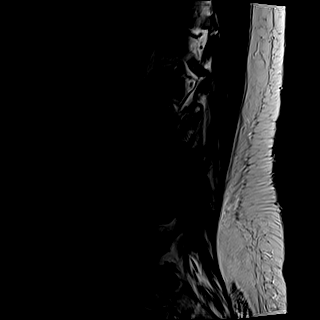
[im 17/17]
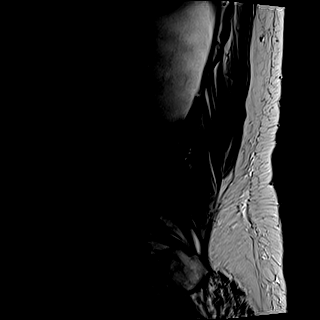

[Series 8: T2 · axial · 4.0mm · 0.62mm/px · z∈[-126,+96]mm · 8 of 48 slices shown (2 of 3)]
[im 1/48]
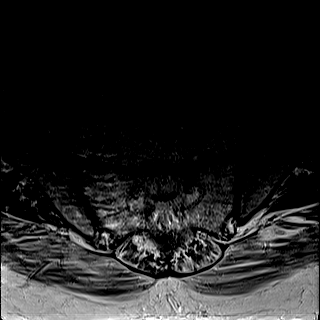
[im 8/48]
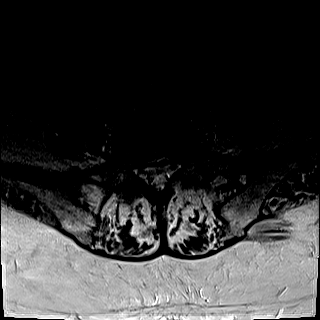
[im 15/48]
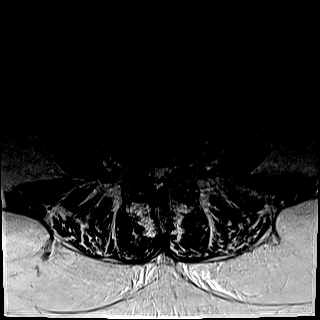
[im 22/48]
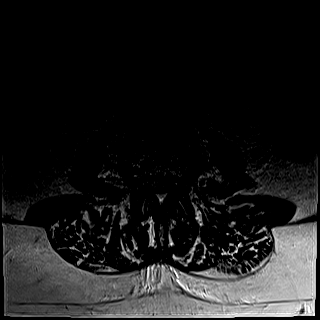
[im 26/48]
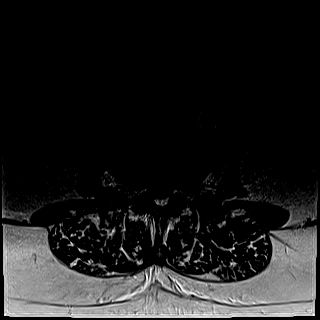
[im 33/48]
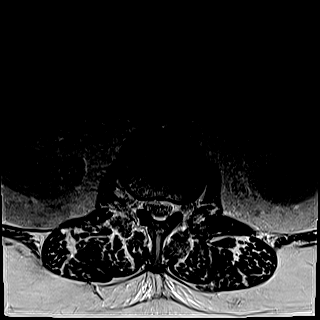
[im 40/48]
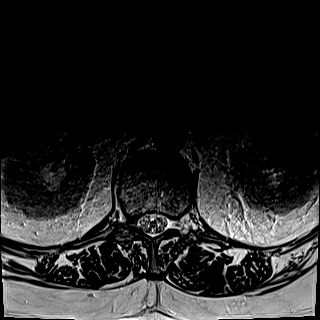
[im 48/48]
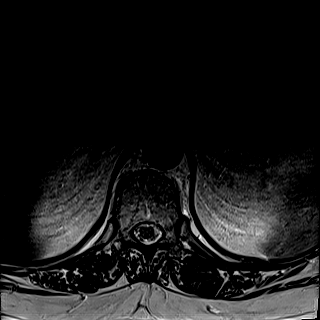

[Series 9: T1 · axial · 4.0mm · 0.39mm/px · z∈[-126,+96]mm · 8 of 48 slices shown (2 of 2)]
[im 1/48]
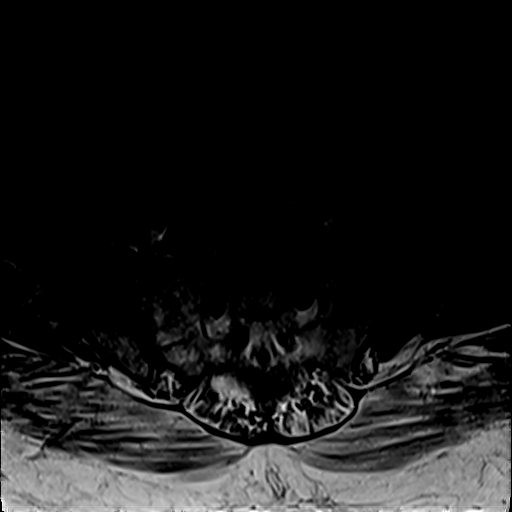
[im 8/48]
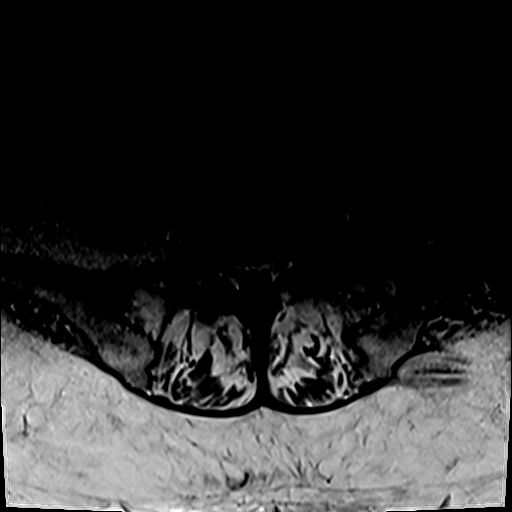
[im 15/48]
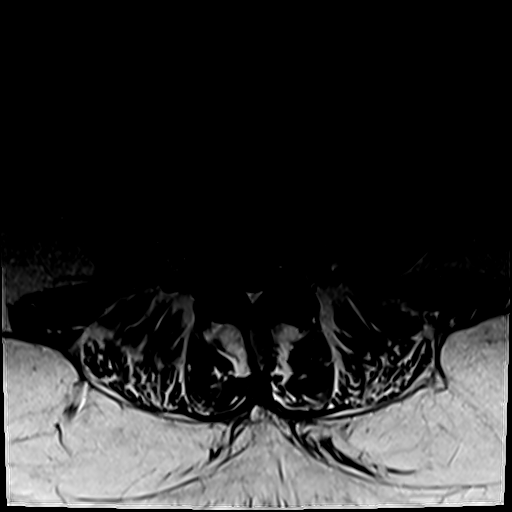
[im 22/48]
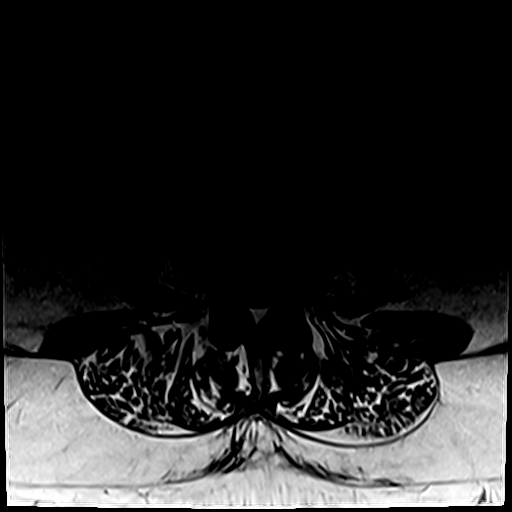
[im 26/48]
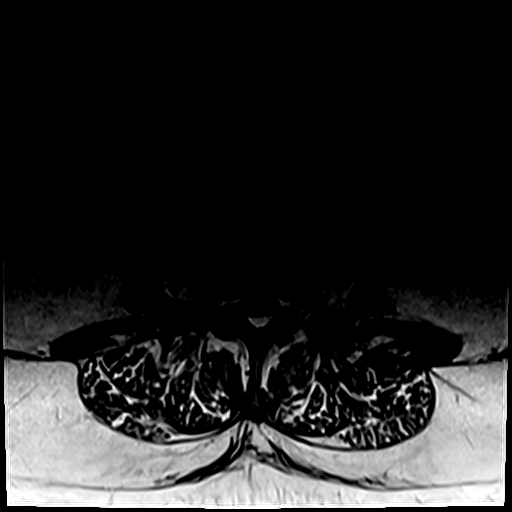
[im 33/48]
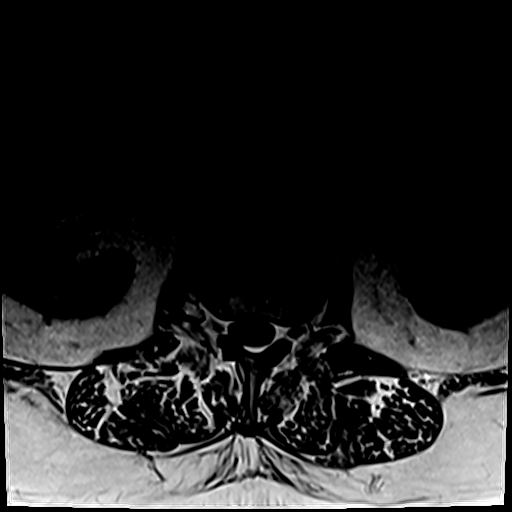
[im 40/48]
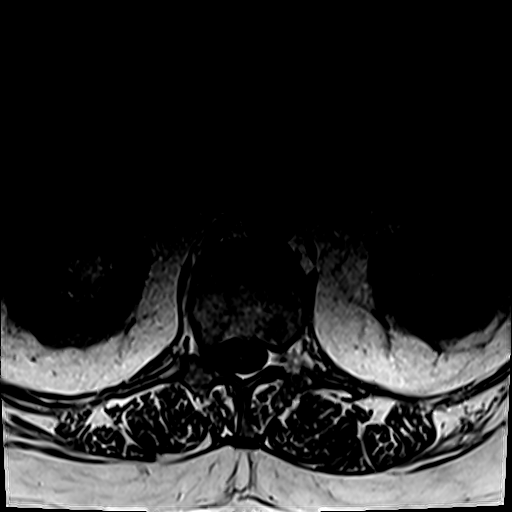
[im 48/48]
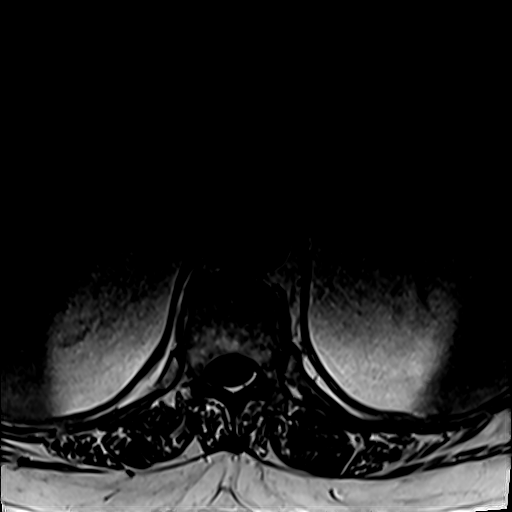

[Series 10: T2 · sagittal · 4.0mm · 0.81mm/px · 5 of 17 slices shown (3 of 3)]
[im 1/17]
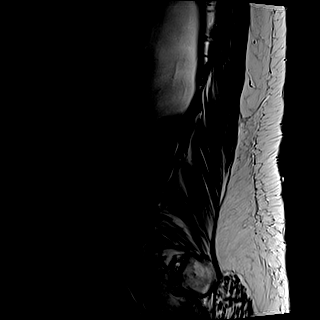
[im 5/17]
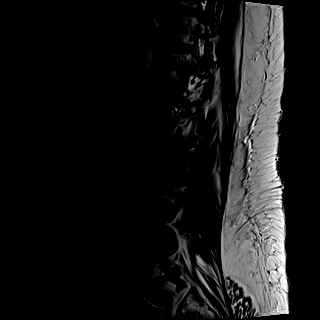
[im 9/17]
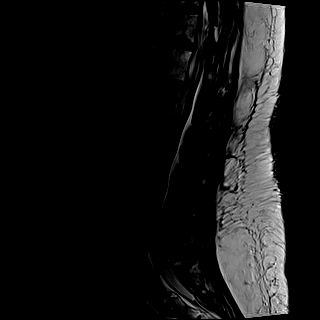
[im 13/17]
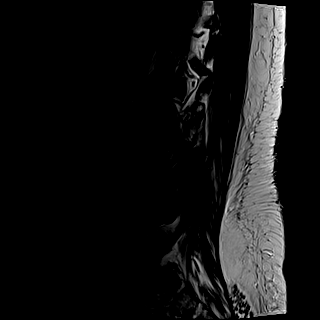
[im 17/17]
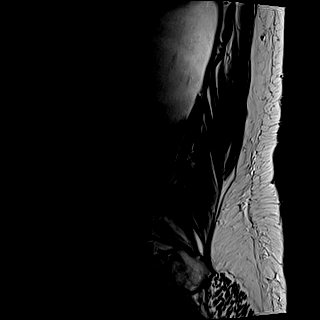

[31 of 48 positions shown; findings below may reference images not displayed]

FINDINGS: Segmentation: Transitional anatomy. Hypoplastic ribs at T12 but only
4 lumbar type vertebral bodies. Therefore fully sacralized L5 level
with vestigial L5-S1 disc is designated. Correlation with
radiographs is recommended prior to any operative intervention.

Alignment: Straightening of lumbar lordosis stable to that seen
yesterday. No spondylolisthesis.

Vertebrae: No marrow edema or evidence of acute osseous abnormality.
Visualized bone marrow signal is within normal limits. Intact
visible sacrum and SI joints.

Conus medullaris and cauda equina: Conus extends to the T12 level.
No lower spinal cord or conus signal abnormality.

Paraspinal and other soft tissues: Stable, negative visible
abdominal and pelvic viscera, paraspinal soft tissues.

Disc levels:

Congenital spinal canal narrowing related to short pedicle distance.

T10-T11: Negative disc but moderate posterior element hypertrophy
with mild spinal stenosis evident on series 6, image 8. Up to mild
spinal cord mass effect but no cord signal abnormality.

T11-T12: Mild facet hypertrophy. Borderline to mild spinal stenosis
overall.

T12-L1: Mild posterior disc bulging and epidural lipomatosis. Mild
spinal stenosis.

L1-L2: Disc desiccation with left eccentric disc bulging. Epidural
lipomatosis and mild posterior element hypertrophy. Moderate spinal
stenosis overall.

L2-L3: Mild disc desiccation with circumferential disc bulge. Mild
to moderate facet hypertrophy and epidural lipomatosis. Severe
spinal stenosis. Mild to moderate bilateral L2 foraminal stenosis
related to foraminal disc and endplate spurring.

L3-L4: Epidural lipomatosis abates at this level. Mild far lateral
disc bulging. Mild posterior element hypertrophy. No significant
stenosis.

L4-L5: Congenital spinal canal narrowing abates at this level.
Subtle rightward disc bulging. Moderate facet hypertrophy. Mild
right L4 foraminal stenosis.

L5-S1:  Sacralized, negative.
IMPRESSION: 1. Transitional lumbosacral anatomy with fully sacralized L5 level.
Correlation with radiographs is recommended prior to any operative
intervention.

2. No acute osseous abnormality in the lumbar spine. Normal visible
bone marrow signal.

3. Combined congenital and acquired spinal stenosis from the visible
lower thoracic spine through L2-L3 - Severe at the latter. Up to
mild lower thoracic spinal cord mass effect but no cord signal
abnormality.

4. Up to moderate bilateral L2 and mild right L4 degenerative neural
foraminal stenosis.

## 2022-05-06 ENCOUNTER — Other Ambulatory Visit: Payer: Self-pay | Admitting: Nephrology

## 2022-05-06 DIAGNOSIS — I129 Hypertensive chronic kidney disease with stage 1 through stage 4 chronic kidney disease, or unspecified chronic kidney disease: Secondary | ICD-10-CM

## 2022-05-06 DIAGNOSIS — N183 Chronic kidney disease, stage 3 unspecified: Secondary | ICD-10-CM

## 2022-05-06 DIAGNOSIS — N1832 Chronic kidney disease, stage 3b: Secondary | ICD-10-CM

## 2022-05-13 ENCOUNTER — Ambulatory Visit
Admission: RE | Admit: 2022-05-13 | Discharge: 2022-05-13 | Disposition: A | Discharge status: Home / Self Care | Payer: BC Managed Care – PPO | Source: Ambulatory Visit | Attending: Nephrology | Admitting: Nephrology

## 2022-05-13 DIAGNOSIS — N183 Other specified diabetes mellitus with diabetic chronic kidney disease: Secondary | ICD-10-CM

## 2022-05-13 DIAGNOSIS — I129 Hypertensive chronic kidney disease with stage 1 through stage 4 chronic kidney disease, or unspecified chronic kidney disease: Secondary | ICD-10-CM

## 2022-05-13 DIAGNOSIS — N1832 Chronic kidney disease, stage 3b: Secondary | ICD-10-CM

## 2022-06-05 NOTE — Unmapped External Note (Signed)
 PACU NOTE:   Patient stable on transport to PACU with supplemental O2 via transport circuit. Airway patent, VSS. Report given to RN patient stable.   Electronically signed by: Brooks, Jenna Kindley, CRNA 06/05/22 1057

## 2022-10-21 NOTE — Progress Notes (Signed)
 Diabetes Education: Dx: Type 2 diabetes mellitus without complication, without long-term current use of insulin    Melody Carpenter is here today for diabetes education related to type 2 diabetes. Last seen by provider, Dr. Kermit on 10/10/22. Patient reports being diagnosed with diabetes about 10 years ago, following many years of steroid use for psoriatic arthritis. She has never received any formal diabetes education and has questions today related to diabetes management.   Learning needs include: monitoring, nutritional management  Time In: 1300 Time Out: 1400  DSME Patient Education: DSME Learning Language Preferred: English Was an interpreter used?: No  Topics of Knowledge Assessment Pathophysiology: Understands key points Healthy Eating: Understands key points Being Active: Understands key points Taking Medications: Understands key points Home glucose monitoring: Understands key points Lifestyle & Healthy Coping: Understands key points Diabetes Distress & Support: Understands key points Post education self-management support (ie. support groups, community programs, PCP follow up): Yes Patient has completed comprehensive DSME group class series.  All topics of education covered in group class series: No Acute Complications: Understands key points Chronic Complications: Understands key points  Activity Goals Does the patient have a goal of being active: 1 Goal 1st Activity Goal: walk for 30 minutes daily Measurement of Goals Met: 50%  Healthy Eating Goals Does the patient have a goal of healthy eating: 1 Goal 1st Healthy Eating Goal: follow balanced plate for meal planning Measurement of Goals Met: 25%  Monitoring: Melody Carpenter reports monitoring BG's once daily. Reports fasting blood sugars have ranges 138-142 mg/dl over the past week or two. She endorses history of hypoglycemia when she was on insulin  and higher doses of glipizide . She denies recent symptomatic lows. A1c  currently in prediabetes range.  Lab Results  Component Value Date   POCA1C 6.3 (A) 10/10/2022    Medications: Glipizide  5 mg once daily Jardiance 10 mg once daily  Meal Planning: Melody Carpenter reports cooking most meals at home, does not eat out often. She has noticed less of an appetite since starting Jardiance, less frequent snacking. Dinner is her main meal. She has a fair understanding of carbohydrates and their effect  on blood sugar. Diet recall: Breakfast: scrambled egg + toast x1 slice Lunch: skips; or 1/2 deli sandwich on whole wheat bread + 1/3 can tomato soup Dinner: grilled chicken or pork chops + green beans or pintos/black eyed peas + salad; or spaghetti + salad Snacks: peanut butter crackers Beverages: coffee (w/ creamer), water, unsweet tea  Physical Activity:  Has been limited by spinal stenosis, psoriatic arthritis. She has recently started doing more gardening and walking up and down her driveway, aims for 30 minutes daily.  Resources: - Balanced plate - Balanced snacks - Nutrition label reading - BG log  Education: - Diabetes disease process and management strategies (monitoring, medications, diet, and physical activity). - Importance of monitoring BG and goals, strategies to improve A1c and target. - Reviewed fasting and postprandial blood sugar targets. Recommend checking BG 2-3x weekly, either fasting or two hours after a meal. - Medication management and role of diabetes medications. Take Glipizide  15-30 minutes before a meal. - Balanced plate for meal planning and importance of macronutrient distribution. - Encouraged intake of complex carbohydrates, lean protein, unsaturated fats, non-starchy vegetables. - Carbohydrate containing goods, portion sizes and grams/serving.   - Have up to 30-45 grams carbohydrates at meals and 15 grams carbohydrates at snacks. - Effects of carbohydrate, protein, fats, and fiber on blood sugars. - Reading food labels - locate  serving size  and total carbohydrate. - Avoid sugar sweetened beverages to assist with blood sugar management.  - Increase physical activity as able. Goal of at least 150 minutes weekly.  Melody Carpenter verbalizes understanding of the above education topics.  Follow up: Melody Carpenter, RD [facility]: 01/27/23 for diet/BG review Dr. Kermit: 01/30/23

## 2023-10-26 ENCOUNTER — Other Ambulatory Visit: Payer: Self-pay | Admitting: Nephrology

## 2023-10-26 DIAGNOSIS — I129 Hypertensive chronic kidney disease with stage 1 through stage 4 chronic kidney disease, or unspecified chronic kidney disease: Secondary | ICD-10-CM

## 2023-10-26 DIAGNOSIS — N1832 Chronic kidney disease, stage 3b: Secondary | ICD-10-CM

## 2023-10-29 ENCOUNTER — Ambulatory Visit
Admission: RE | Admit: 2023-10-29 | Discharge: 2023-10-29 | Disposition: A | Discharge status: Home / Self Care | Source: Ambulatory Visit | Attending: Nephrology | Admitting: Nephrology

## 2023-10-29 DIAGNOSIS — N1832 Chronic kidney disease, stage 3b: Secondary | ICD-10-CM

## 2023-10-29 DIAGNOSIS — I129 Hypertensive chronic kidney disease with stage 1 through stage 4 chronic kidney disease, or unspecified chronic kidney disease: Secondary | ICD-10-CM

## 2024-01-08 ENCOUNTER — Encounter: Payer: Self-pay | Admitting: Advanced Practice Midwife

## 2024-06-16 ENCOUNTER — Other Ambulatory Visit: Payer: Self-pay | Admitting: Medical Genetics

## 2024-07-19 ENCOUNTER — Other Ambulatory Visit: Payer: Self-pay | Admitting: Medical Genetics

## 2024-07-19 DIAGNOSIS — Z006 Encounter for examination for normal comparison and control in clinical research program: Secondary | ICD-10-CM

## 2024-08-10 ENCOUNTER — Other Ambulatory Visit (HOSPITAL_COMMUNITY): Payer: Self-pay
